# Patient Record
Sex: Male | Born: 1965 | Race: White | Hispanic: No | Marital: Married | Smoking: Former smoker
Health system: Southern US, Community
[De-identification: ages and names within clinical notes are randomized; demographics above are authoritative.]

## PROBLEM LIST (undated history)

## (undated) DIAGNOSIS — G629 Polyneuropathy, unspecified: Secondary | ICD-10-CM

## (undated) DIAGNOSIS — M109 Gout, unspecified: Secondary | ICD-10-CM

## (undated) HISTORY — DX: Polyneuropathy, unspecified: G62.9

---

## 2000-12-21 ENCOUNTER — Emergency Department (HOSPITAL_COMMUNITY): Admission: EM | Admit: 2000-12-21 | Discharge: 2000-12-21 | Payer: Self-pay | Admitting: Emergency Medicine

## 2000-12-21 ENCOUNTER — Encounter: Payer: Self-pay | Admitting: Emergency Medicine

## 2004-04-06 ENCOUNTER — Emergency Department (HOSPITAL_COMMUNITY): Admission: EM | Admit: 2004-04-06 | Discharge: 2004-04-06 | Payer: Self-pay | Admitting: Emergency Medicine

## 2004-05-28 ENCOUNTER — Emergency Department (HOSPITAL_COMMUNITY): Admission: EM | Admit: 2004-05-28 | Discharge: 2004-05-28 | Payer: Self-pay | Admitting: Emergency Medicine

## 2004-05-29 ENCOUNTER — Emergency Department (HOSPITAL_COMMUNITY): Admission: EM | Admit: 2004-05-29 | Discharge: 2004-05-29 | Payer: Self-pay | Admitting: Emergency Medicine

## 2004-10-07 ENCOUNTER — Emergency Department (HOSPITAL_COMMUNITY): Admission: EM | Admit: 2004-10-07 | Discharge: 2004-10-07 | Payer: Self-pay | Admitting: Emergency Medicine

## 2005-02-23 ENCOUNTER — Emergency Department (HOSPITAL_COMMUNITY): Admission: EM | Admit: 2005-02-23 | Discharge: 2005-02-23 | Payer: Self-pay | Admitting: *Deleted

## 2007-09-13 ENCOUNTER — Emergency Department (HOSPITAL_COMMUNITY): Admission: EM | Admit: 2007-09-13 | Discharge: 2007-09-13 | Payer: Self-pay | Admitting: Emergency Medicine

## 2007-09-27 ENCOUNTER — Ambulatory Visit: Payer: Self-pay | Admitting: Family Medicine

## 2007-09-27 DIAGNOSIS — F17200 Nicotine dependence, unspecified, uncomplicated: Secondary | ICD-10-CM

## 2007-09-27 DIAGNOSIS — M545 Low back pain, unspecified: Secondary | ICD-10-CM | POA: Insufficient documentation

## 2007-09-27 DIAGNOSIS — K029 Dental caries, unspecified: Secondary | ICD-10-CM | POA: Insufficient documentation

## 2007-09-27 DIAGNOSIS — M25529 Pain in unspecified elbow: Secondary | ICD-10-CM | POA: Insufficient documentation

## 2007-09-27 DIAGNOSIS — M109 Gout, unspecified: Secondary | ICD-10-CM

## 2007-09-28 ENCOUNTER — Telehealth (INDEPENDENT_AMBULATORY_CARE_PROVIDER_SITE_OTHER): Payer: Self-pay | Admitting: Family Medicine

## 2007-09-28 ENCOUNTER — Ambulatory Visit: Payer: Self-pay | Admitting: Family Medicine

## 2007-09-28 LAB — CONVERTED CEMR LAB
Basophils Absolute: 0 10*3/uL (ref 0.0–0.1)
Basophils Relative: 0 % (ref 0–1)
Eosinophils Absolute: 0.1 10*3/uL (ref 0.0–0.7)
Eosinophils Relative: 1 % (ref 0–5)
HCT: 42.5 % (ref 39.0–52.0)
Hemoglobin: 14.3 g/dL (ref 13.0–17.0)
Lymphocytes Relative: 22 % (ref 12–46)
Lymphs Abs: 2.4 10*3/uL (ref 0.7–4.0)
MCHC: 33.6 g/dL (ref 30.0–36.0)
MCV: 92.2 fL (ref 78.0–100.0)
Monocytes Absolute: 0.8 10*3/uL (ref 0.1–1.0)
Monocytes Relative: 8 % (ref 3–12)
Neutro Abs: 7.7 10*3/uL (ref 1.7–7.7)
Neutrophils Relative %: 69 % (ref 43–77)
Platelets: 256 10*3/uL (ref 150–400)
RBC: 4.61 M/uL (ref 4.22–5.81)
RDW: 12.9 % (ref 11.5–15.5)
Uric Acid, Serum: 6.6 mg/dL (ref 4.0–7.8)
WBC: 11.1 10*3/uL — ABNORMAL HIGH (ref 4.0–10.5)

## 2007-10-25 ENCOUNTER — Ambulatory Visit: Payer: Self-pay | Admitting: Family Medicine

## 2007-11-19 ENCOUNTER — Ambulatory Visit: Payer: Self-pay | Admitting: Family Medicine

## 2007-11-19 DIAGNOSIS — M25569 Pain in unspecified knee: Secondary | ICD-10-CM

## 2007-11-20 ENCOUNTER — Telehealth (INDEPENDENT_AMBULATORY_CARE_PROVIDER_SITE_OTHER): Payer: Self-pay | Admitting: *Deleted

## 2007-11-20 ENCOUNTER — Ambulatory Visit (HOSPITAL_COMMUNITY): Admission: RE | Admit: 2007-11-20 | Discharge: 2007-11-20 | Payer: Self-pay | Admitting: Family Medicine

## 2007-11-20 ENCOUNTER — Encounter (INDEPENDENT_AMBULATORY_CARE_PROVIDER_SITE_OTHER): Payer: Self-pay | Admitting: Family Medicine

## 2007-11-20 LAB — CONVERTED CEMR LAB
Anti Nuclear Antibody(ANA): NEGATIVE
CRP: 1.2 mg/dL — ABNORMAL HIGH (ref <0.6)
Rheumatoid fact SerPl-aCnc: <20 intl units/mL (ref 0–20)
Sed Rate: 31 mm/hr — ABNORMAL HIGH (ref 0–16)
Uric Acid, Serum: 6.5 mg/dL (ref 4.0–7.8)

## 2007-11-21 ENCOUNTER — Ambulatory Visit: Payer: Self-pay | Admitting: Family Medicine

## 2007-11-29 ENCOUNTER — Encounter (INDEPENDENT_AMBULATORY_CARE_PROVIDER_SITE_OTHER): Payer: Self-pay | Admitting: Family Medicine

## 2007-12-10 ENCOUNTER — Encounter (INDEPENDENT_AMBULATORY_CARE_PROVIDER_SITE_OTHER): Payer: Self-pay | Admitting: Family Medicine

## 2007-12-19 ENCOUNTER — Ambulatory Visit: Payer: Self-pay | Admitting: Internal Medicine

## 2007-12-19 DIAGNOSIS — M5430 Sciatica, unspecified side: Secondary | ICD-10-CM

## 2008-01-18 ENCOUNTER — Encounter (INDEPENDENT_AMBULATORY_CARE_PROVIDER_SITE_OTHER): Payer: Self-pay | Admitting: Family Medicine

## 2009-05-19 IMAGING — CR DG ELBOW 2V*L*
2 series · 2 of 2 positions shown · non-contrast
Comparison: None

CLINICAL DATA: Left elbow pain

LEFT ELBOW - 2 VIEW

[view not recorded (1 of 2)]
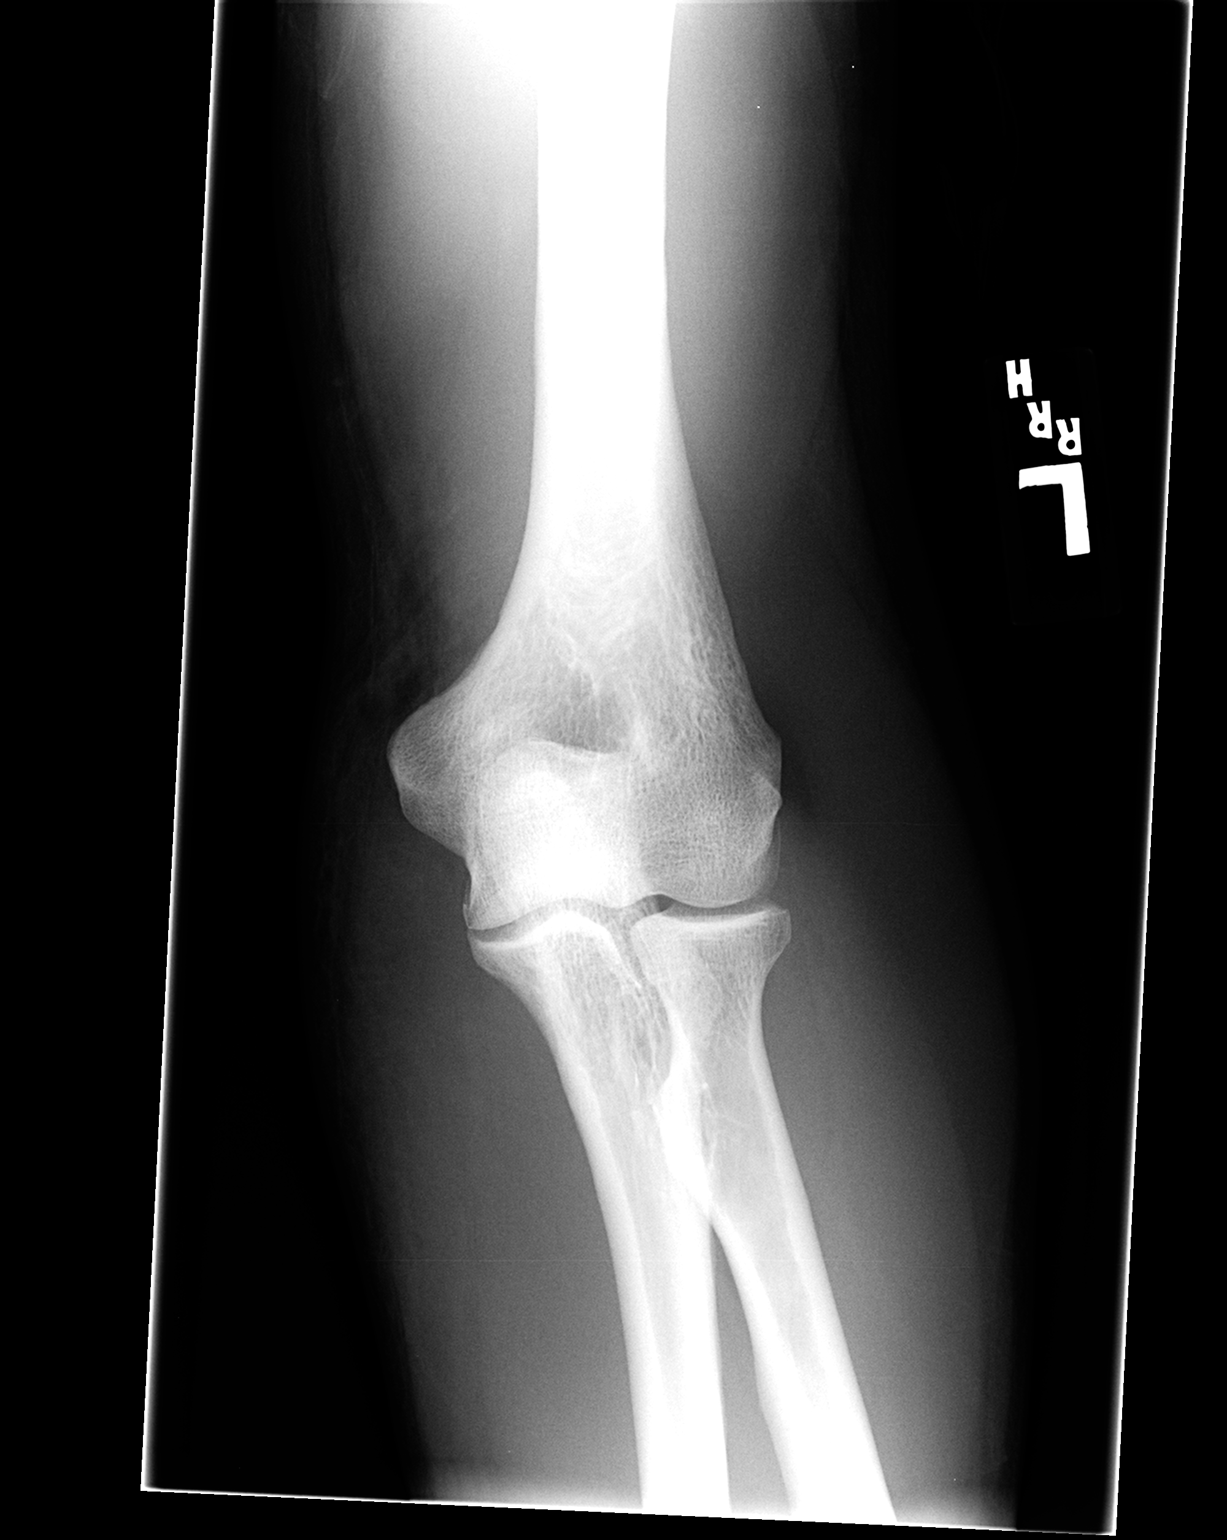

[view not recorded (2 of 2)]
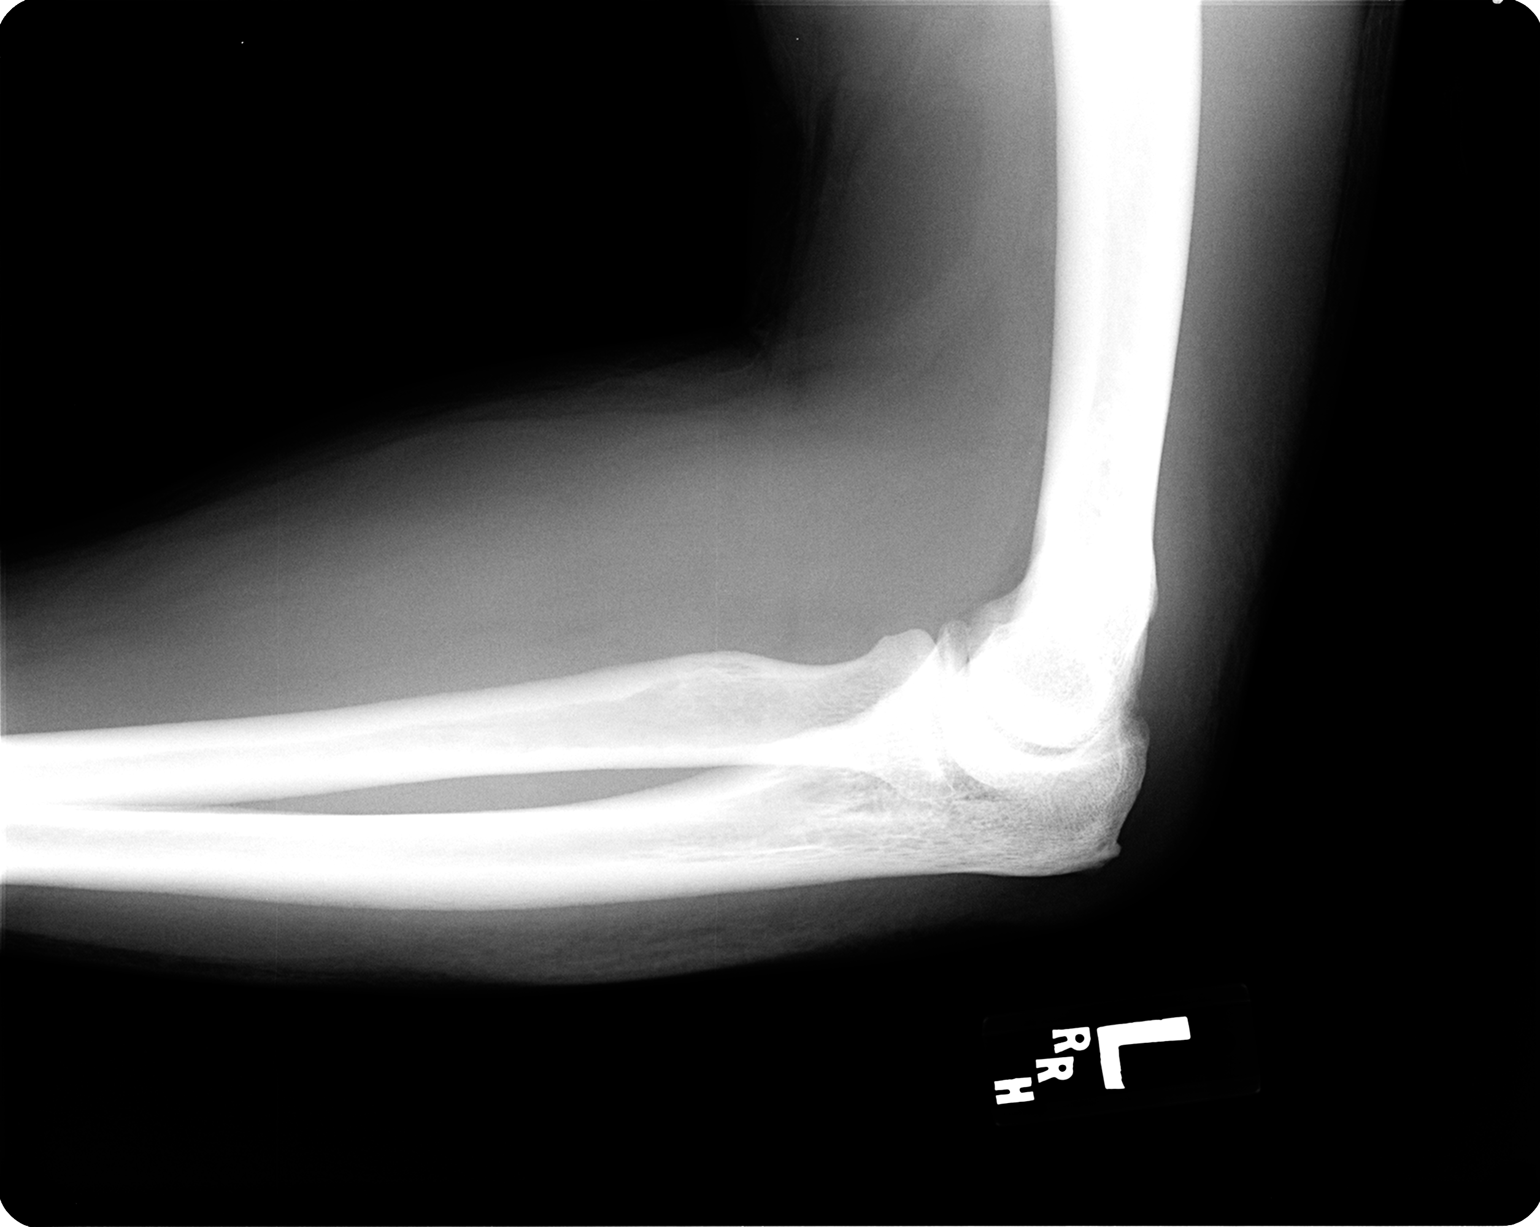

[2 of 2 positions shown; findings below may reference images not displayed]

FINDINGS: Two views of the left elbow shows no acute fracture or
subluxation.  No posterior fat pad sign.
IMPRESSION: No left elbow acute fracture or subluxation.  No posterior fat pad
sign.

## 2012-11-12 ENCOUNTER — Emergency Department (HOSPITAL_COMMUNITY): Payer: BC Managed Care – PPO

## 2012-11-12 ENCOUNTER — Encounter (HOSPITAL_COMMUNITY): Payer: Self-pay | Admitting: *Deleted

## 2012-11-12 ENCOUNTER — Emergency Department (HOSPITAL_COMMUNITY)
Admission: EM | Admit: 2012-11-12 | Discharge: 2012-11-12 | Disposition: A | Discharge status: Home / Self Care | Payer: BC Managed Care – PPO | Attending: Emergency Medicine | Admitting: Emergency Medicine

## 2012-11-12 DIAGNOSIS — M5380 Other specified dorsopathies, site unspecified: Secondary | ICD-10-CM | POA: Insufficient documentation

## 2012-11-12 DIAGNOSIS — Z8639 Personal history of other endocrine, nutritional and metabolic disease: Secondary | ICD-10-CM | POA: Insufficient documentation

## 2012-11-12 DIAGNOSIS — F17200 Nicotine dependence, unspecified, uncomplicated: Secondary | ICD-10-CM | POA: Insufficient documentation

## 2012-11-12 DIAGNOSIS — Z862 Personal history of diseases of the blood and blood-forming organs and certain disorders involving the immune mechanism: Secondary | ICD-10-CM | POA: Insufficient documentation

## 2012-11-12 DIAGNOSIS — M62830 Muscle spasm of back: Secondary | ICD-10-CM

## 2012-11-12 HISTORY — DX: Gout, unspecified: M10.9

## 2012-11-12 MED ORDER — METHOCARBAMOL 500 MG PO TABS: 1000.0000 mg | ORAL_TABLET | Freq: Four times a day (QID) | ORAL | Status: DC

## 2012-11-12 MED ORDER — DIAZEPAM 5 MG PO TABS
5.0000 mg | ORAL_TABLET | Freq: Once | ORAL | Status: AC
  Administered 2012-11-12: 5 mg via ORAL
  Filled 2012-11-12: qty 1

## 2012-11-12 MED ORDER — METHOCARBAMOL 500 MG PO TABS
1000.0000 mg | ORAL_TABLET | Freq: Once | ORAL | Status: AC
  Administered 2012-11-12: 1000 mg via ORAL
  Filled 2012-11-12: qty 2

## 2012-11-12 MED ORDER — OXYCODONE-ACETAMINOPHEN 5-325 MG PO TABS: 1.0000 | ORAL_TABLET | ORAL | Status: DC | PRN

## 2012-11-12 MED ORDER — OXYCODONE-ACETAMINOPHEN 5-325 MG PO TABS
1.0000 | ORAL_TABLET | Freq: Once | ORAL | Status: AC
  Administered 2012-11-12: 1 via ORAL
  Filled 2012-11-12: qty 1

## 2012-11-12 MED ORDER — DIAZEPAM 5 MG PO TABS: 5.0000 mg | ORAL_TABLET | Freq: Three times a day (TID) | ORAL | Status: DC | PRN

## 2012-11-12 NOTE — ED Notes (Signed)
States was working on car x 1 wk ago when may have pulled muscle then. States pain has gotten worse over past two days.  Severe pain worsening with deep breath since 0300 this morning.  Pain to right mid-lower back.

## 2012-11-16 NOTE — ED Provider Notes (Signed)
Medical screening examination/treatment/procedure(s) were performed by non-physician practitioner and as supervising physician I was immediately available for consultation/collaboration.   Zakyria Metzinger, MD 11/16/12 2259 

## 2012-11-16 NOTE — ED Provider Notes (Signed)
History     CSN: [id number]  Arrival date & time 11/12/12  0906   First MD Initiated Contact with Patient 11/12/12 [phone]      Chief Complaint  Patient presents with  . Back Pain    (Consider location/radiation/quality/duration/timing/severity/associated sxs/prior treatment) HPI Comments: Jeff Benton is a 47 y.o. Male presenting with a 1 week history of right lateral back pain which started suddenly when he reached up while working underneath a car at work.  His pain is described as sharp muscle spasm which worsens with movement,  Palpation and now with deep inspiration as it has worsened over the past 2 days.  He denies shortness of breath and chest pain, denies cough, fever and denies urinary changes including hematuria, increased frequency or dysuria.  He has tried goody powders and muscle rub without relief.  This morning before arrival he was taking a hot shower when he felt the muscle spasm worsen, therefore presenting here.    The history is provided by the patient and the spouse.    Past Medical History  Diagnosis Date  . Gout     History reviewed. No pertinent past surgical history.  No family history on file.  History  Substance Use Topics  . Smoking status: Current Every Day Smoker    Types: Cigarettes  . Smokeless tobacco: Not on file  . Alcohol Use: Yes     Comment: occasional      Review of Systems  Constitutional: Negative for fever and chills.  Respiratory: Negative for cough, chest tightness and shortness of breath.   Cardiovascular: Negative for chest pain and leg swelling.  Gastrointestinal: Negative for abdominal pain, constipation and abdominal distention.  Genitourinary: Negative for dysuria, urgency, frequency, hematuria, flank pain and difficulty urinating.  Musculoskeletal: Positive for back pain. Negative for joint swelling and gait problem.  Skin: Negative for rash.  Neurological: Negative for weakness and numbness.    Allergies   Review of patient's allergies indicates no known allergies.  Home Medications   Current Outpatient Rx  Name  Route  Sig  Dispense  Refill  . Aspirin-Acetaminophen-Caffeine (GOODYS EXTRA STRENGTH PO)   Oral   Take 1 Package by mouth every 6 (six) hours as needed (back pain).         . diazepam (VALIUM) 5 MG tablet   Oral   Take 1 tablet (5 mg total) by mouth every 8 (eight) hours as needed (muscle spasm).   15 tablet   0   . oxyCODONE-acetaminophen (PERCOCET/ROXICET) 5-325 MG per tablet   Oral   Take 1 tablet by mouth every 4 (four) hours as needed for pain.   20 tablet   0     BP 135/88  Pulse 65  Temp(Src) 98.1 F (36.7 C) (Oral)  Resp 20  Ht 5\' 10"  (1.778 m)  Wt 180 lb (81.647 kg)  BMI 25.83 kg/m2  SpO2 99%  Physical Exam  Nursing note and vitals reviewed. Constitutional: He appears well-developed and well-nourished.  HENT:  Head: Normocephalic.  Eyes: Conjunctivae are normal.  Neck: Normal range of motion. Neck supple.  Cardiovascular: Normal rate and intact distal pulses.   Pedal pulses normal.  Pulmonary/Chest: Effort normal.  Abdominal: Soft. Bowel sounds are normal. He exhibits no distension and no mass.  Musculoskeletal: Normal range of motion. He exhibits tenderness. He exhibits no edema.       Thoracic back: He exhibits tenderness and spasm. He exhibits no bony tenderness, no swelling and no edema.  Lumbar back: He exhibits tenderness. He exhibits no swelling, no edema and no spasm.  TTP right parathoracic spine,  No midline tenderness.  Muscle spasm appreciated,  No rash.  Neurological: He is alert. He has normal strength. He displays no atrophy and no tremor. No sensory deficit. Gait normal.  Equal grip strength.  Skin: Skin is warm and dry.  Psychiatric: He has a normal mood and affect.    ED Course  Procedures (including critical care time)  Labs Reviewed - No data to display No results found.   1. Muscle spasm of back    Pt was  given oxycodone and robaxin with pain mild pain improvement.  He was offered IM injections for better pain relief which he refused.  He was given another oxycodone and trial of valium for hopeful better relief of muscle spasm. Prescription for same given.   MDM  Pt with reproducible right thoracic back pain and muscle spasm.  He was prescribed oxycodone and valium.   Pt was hypertensive upon first arrival,  Near normotensive at dc.  He is perc negative, doubt PE.    Patients labs and/or radiological studies were viewed and considered during the medical decision making and disposition process.       Burgess Amor, PA-C 11/16/12 1611

## 2014-05-12 IMAGING — CR DG CHEST 2V
2 series · 2 of 2 positions shown · non-contrast
Comparison: None

CLINICAL DATA: Back pain, muscle pain since working on a car 1 week
ago, feels like a pulled muscle in right side of back

CHEST - 2 VIEW

[view not recorded (1 of 2)]
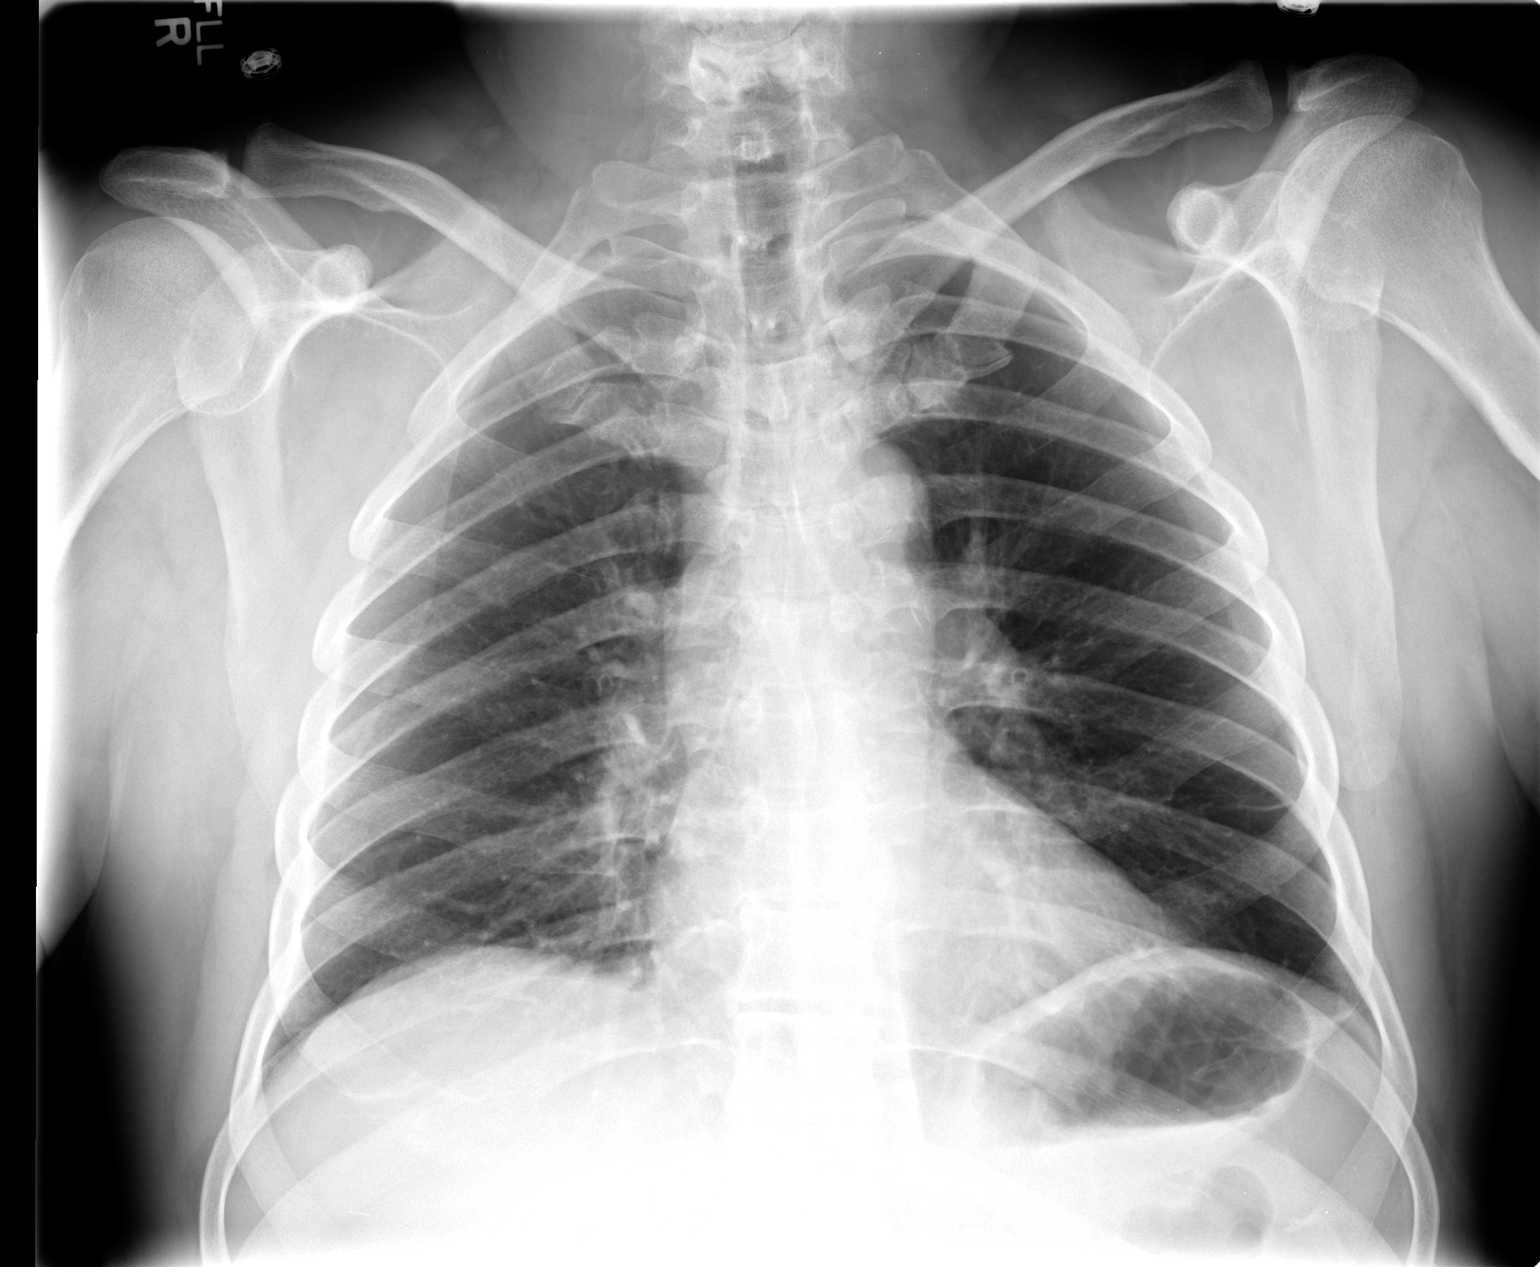

[view not recorded (2 of 2)]
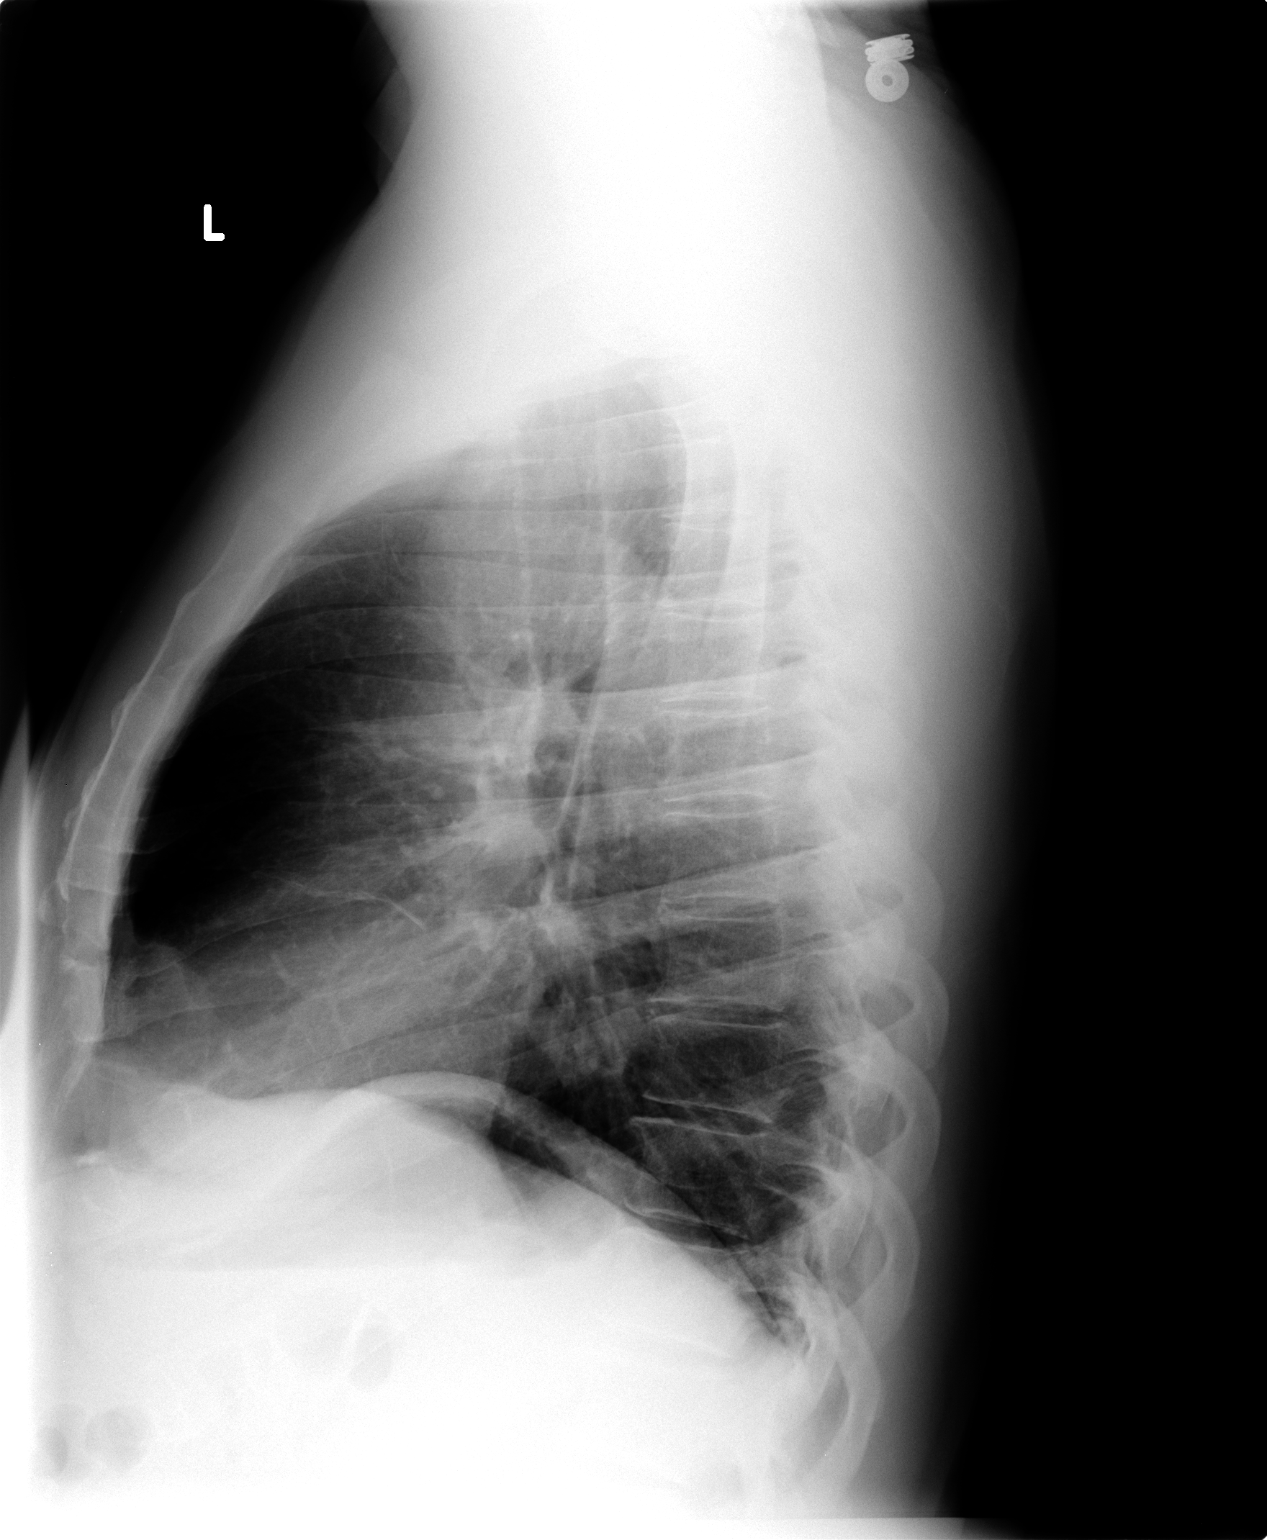

[2 of 2 positions shown; findings below may reference images not displayed]

FINDINGS: Normal heart size, mediastinal contours, and pulmonary vascularity.
Bronchitic changes without infiltrate, pleural effusion or
pneumothorax.
Minimal linear scarring versus subsegmental atelectasis at lingula.
Bones unremarkable.
IMPRESSION: Bronchitic changes with minimal scarring or subsegmental
atelectasis at lingula.

## 2015-08-27 ENCOUNTER — Telehealth: Payer: Self-pay | Admitting: Orthopaedic Surgery

## 2015-08-27 MED ORDER — HYDROCODONE-ACETAMINOPHEN 7.5-325 MG PO TABS: 1.0000 | ORAL_TABLET | ORAL | Status: DC | PRN

## 2015-08-27 NOTE — Telephone Encounter (Signed)
Rx printed

## 2015-08-27 NOTE — Telephone Encounter (Signed)
Prescription available, called patient, no answer 

## 2015-08-27 NOTE — Telephone Encounter (Signed)
Patient called to request refill of pain medication, Hydrocodone/Norco 7.5, 4 times daily, quantity 120.  His cell ph# is [phone]

## 2015-09-23 ENCOUNTER — Ambulatory Visit: Payer: Self-pay | Admitting: Orthopaedic Surgery

## 2015-10-01 ENCOUNTER — Ambulatory Visit (INDEPENDENT_AMBULATORY_CARE_PROVIDER_SITE_OTHER): Payer: Self-pay | Admitting: Orthopaedic Surgery

## 2015-10-01 ENCOUNTER — Encounter: Payer: Self-pay | Admitting: Orthopaedic Surgery

## 2015-10-01 VITALS — BP 135/89 | HR 73 | Temp 99.0°F | Resp 16 | Ht 70.0 in | Wt 197.0 lb

## 2015-10-01 DIAGNOSIS — M1A0610 Idiopathic chronic gout, right knee, without tophus (tophi): Secondary | ICD-10-CM

## 2015-10-01 MED ORDER — HYDROCODONE-ACETAMINOPHEN 7.5-325 MG PO TABS: 1.0000 | ORAL_TABLET | ORAL | Status: DC | PRN

## 2015-10-01 NOTE — Progress Notes (Signed)
Patient ZO:XWRUEAV:Jeff Benton, male DOB:1966-05-23, 50 y.o. WUJ:[id number]  Chief Complaint  Patient presents with  . Follow-up    follow up bilateral knee pain    HPI  Jeff Benton is a 50 y.o. male who has bilateral knee pain, worse on the right.  He has gout.  He takes Uloric. He has no recent attacks.  He has had giving way of the left knee once since I last saw him.  He has swelling and popping of both knees.  He has no new trauma, no redness, no locking.  HPI  Body mass index is 28.27 kg/(m^2).  Review of Systems  Constitutional:       Patient does not have Diabetes Mellitus. Patient does not have hypertension. Patient does not have COPD or shortness of breath. Patient does not have BMI > 35. Patient does not have current smoking history.  HENT: Negative for congestion.   Respiratory: Negative for shortness of breath.   Cardiovascular: Negative for chest pain.  Endocrine: Positive for cold intolerance.  Musculoskeletal: Positive for joint swelling and gait problem.    Past Medical History  Diagnosis Date  . Gout     No past surgical history on file.  No family history on file.  Social History Social History  Substance Use Topics  . Smoking status: Former Smoker    Types: Cigarettes    Quit date: 08/03/2015  . Smokeless tobacco: None  . Alcohol Use: 0.0 oz/week    0 Standard drinks or equivalent per week     Comment: occasional    No Known Allergies  Current Outpatient Prescriptions  Medication Sig Dispense Refill  . Febuxostat (ULORIC PO) Take by mouth.    Marland Kitchen. HYDROcodone-acetaminophen (NORCO) 7.5-325 MG tablet Take 1 tablet by mouth every 4 (four) hours as needed for moderate pain (Must last 30 days.  Do not drive or operate machinery while taking this medicine.). 120 tablet 0   No current facility-administered medications for this visit.     Physical Exam  Blood pressure 135/89, pulse 73, temperature 99 F (37.2 C), resp. rate 16, height 5\' 10"   (1.778 m), weight 197 lb (89.359 kg).  Constitutional: overall normal hygiene, normal nutrition, well developed, normal grooming, normal body habitus. Assistive device:none  Musculoskeletal: gait and station Limp right, muscle tone and strength are normal, no tremors or atrophy is present.  .  Neurological: coordination overall normal.  Deep tendon reflex/nerve stretch intact.  Sensation normal.  Cranial nerves II-XII intact.   Skin:   normal overall no scars, lesions, ulcers or rashes. No psoriasis.  Psychiatric: Alert and oriented x 3.  Recent memory intact, remote memory unclear.  Normal mood and affect. Well groomed.  Good eye contact.  Cardiovascular: overall no swelling, no varicosities, no edema bilaterally, normal temperatures of the legs and arms, no clubbing, cyanosis and good capillary refill.  Lymphatic: palpation is normal.  The right lower extremity is examined:  Inspection:  Thigh:  Non-tender and no defects  Knee has swelling 2+ effusion.                        Joint tenderness is present                        Patient is tender over the medial joint line  Lower Leg:  Has normal appearance and no tenderness or defects  Ankle:  Non-tender and no defects  Foot:  Non-tender  and no defects Range of Motion:  Knee:  Range of motion is: 0-110                        Crepitus is  present  Ankle:  Range of motion is normal. Strength and Tone:  The right lower extremity has normal strength and tone. Stability:  Knee:  The knee is stable.  Ankle:  The ankle is stable.   The patient has been educated about the nature of the problem(s) and counseled on treatment options.  The patient appeared to understand what I have discussed and is in agreement with it.  Encounter Diagnosis  Name Primary?  . 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  Precautions discussed.  Continue current medications.   Return to clinic 3  months

## 2015-11-02 ENCOUNTER — Telehealth: Payer: Self-pay | Admitting: Orthopaedic Surgery

## 2015-11-02 MED ORDER — HYDROCODONE-ACETAMINOPHEN 7.5-325 MG PO TABS: 1.0000 | ORAL_TABLET | ORAL | Status: DC | PRN

## 2015-11-02 NOTE — Telephone Encounter (Signed)
Rx Done . 

## 2015-11-02 NOTE — Telephone Encounter (Signed)
Patient called for refill on medication: HYDROcodone-acetaminophen (NORCO) 7.5-325 MG tablet [[id number]][[id number]] - quantity 120. Please advise.

## 2015-11-04 ENCOUNTER — Telehealth: Payer: Self-pay | Admitting: Orthopaedic Surgery

## 2015-11-04 NOTE — Telephone Encounter (Signed)
Rx Done . 

## 2015-12-03 ENCOUNTER — Telehealth: Payer: Self-pay | Admitting: Orthopaedic Surgery

## 2015-12-03 MED ORDER — HYDROCODONE-ACETAMINOPHEN 7.5-325 MG PO TABS: 1.0000 | ORAL_TABLET | ORAL | Status: DC | PRN

## 2015-12-03 NOTE — Telephone Encounter (Signed)
Patient called and requested a refill on Hydrocodone-Acetaminophen (Norco)  7.5-325 mgs. Qty 120 Sig: Take 1 tablet by mouth every 4 (four) hours as needed for moderate pain (Must last 30 days.  Do not drive or operate machinery while taking this medicine.). °

## 2015-12-03 NOTE — Telephone Encounter (Signed)
Rx done. 

## 2015-12-31 ENCOUNTER — Ambulatory Visit (INDEPENDENT_AMBULATORY_CARE_PROVIDER_SITE_OTHER): Payer: BLUE CROSS/BLUE SHIELD | Admitting: Orthopaedic Surgery

## 2015-12-31 ENCOUNTER — Encounter: Payer: Self-pay | Admitting: Orthopaedic Surgery

## 2015-12-31 VITALS — BP 161/94 | HR 71 | Temp 98.1°F | Ht 69.0 in | Wt 194.0 lb

## 2015-12-31 DIAGNOSIS — M1A0610 Idiopathic chronic gout, right knee, without tophus (tophi): Secondary | ICD-10-CM | POA: Diagnosis not present

## 2015-12-31 MED ORDER — HYDROCODONE-ACETAMINOPHEN 7.5-325 MG PO TABS: 1.0000 | ORAL_TABLET | ORAL | Status: DC | PRN

## 2015-12-31 NOTE — Progress Notes (Signed)
Patient Jeff Benton, male DOB:30-Oct-1965, 50 y.o. JXB:[id number]  Chief Complaint  Patient presents with  . Follow-up    Bilateral knee pain    HPI  Jeff Benton is a 50 y.o. male who has gout and right knee pain.  His left knee is not hurting.  He had a gouty attack of the right knee last month but is fine today. He has no new trauma, no redness, no giving way.  He is taking his gout medicine.  HPI  Body mass index is 28.64 kg/(m^2).  ROS  Review of Systems  Constitutional:       Patient does not have Diabetes Mellitus. Patient does not have hypertension. Patient does not have COPD or shortness of breath. Patient does not have BMI > 35. Patient does not have current smoking history.  HENT: Negative for congestion.   Respiratory: Negative for shortness of breath.   Cardiovascular: Negative for chest pain.  Endocrine: Positive for cold intolerance.  Musculoskeletal: Positive for joint swelling and gait problem.    Past Medical History  Diagnosis Date  . Gout     History reviewed. No pertinent past surgical history.  History reviewed. No pertinent family history.  Social History Social History  Substance Use Topics  . Smoking status: Former Smoker    Types: Cigarettes    Quit date: 08/03/2015  . Smokeless tobacco: None  . Alcohol Use: 0.0 oz/week    0 Standard drinks or equivalent per week     Comment: occasional    No Known Allergies  Current Outpatient Prescriptions  Medication Sig Dispense Refill  . HYDROcodone-acetaminophen (NORCO) 7.5-325 MG tablet Take 1 tablet by mouth every 4 (four) hours as needed for moderate pain (Must last 30 days.  Do not drive or operate machinery while taking this medicine.). 120 tablet 0  . ULORIC 80 MG TABS TAKE ONE TABLET BY MOUTH ONCE DAILY. 30 tablet 5   No current facility-administered medications for this visit.     Physical Exam  Blood pressure 161/94, pulse 71, temperature 98.1 F (36.7 C), height 5\' 9"   (1.753 m), weight 194 lb (87.998 kg).  Constitutional: overall normal hygiene, normal nutrition, well developed, normal grooming, normal body habitus. Assistive device:none  Musculoskeletal: gait and station Limp right, muscle tone and strength are normal, no tremors or atrophy is present.  .  Neurological: coordination overall normal.  Deep tendon reflex/nerve stretch intact.  Sensation normal.  Cranial nerves II-XII intact.   Skin:   normal overall no scars, lesions, ulcers or rashes. No psoriasis.  Psychiatric: Alert and oriented x 3.  Recent memory intact, remote memory unclear.  Normal mood and affect. Well groomed.  Good eye contact.  Cardiovascular: overall no swelling, no varicosities, no edema bilaterally, normal temperatures of the legs and arms, no clubbing, cyanosis and good capillary refill.  Lymphatic: palpation is normal.  The right lower extremity is examined:  Inspection:  Thigh:  Non-tender and no defects  Knee does not have swelling 0 effusion.                        Joint tenderness is present                        Patient is not tender over the medial joint line  Lower Leg:  Has normal appearance and no tenderness or defects  Ankle:  Non-tender and no defects  Foot:  Non-tender and no  defects Range of Motion:  Knee:  Range of motion is: 0-115                        Crepitus is  present  Ankle:  Range of motion is normal. Strength and Tone:  The right lower extremity has normal strength and tone. Stability:  Knee:  The knee is stable.  Ankle:  The ankle is stable.    The patient has been educated about the nature of the problem(s) and counseled on treatment options.  The patient appeared to understand what I have discussed and is in agreement with it.  Encounter Diagnosis  Name Primary?  . 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  Precautions discussed.  Continue current medications.   Return to clinic  3 months   Electronically Signed Darreld McleanWayne Daneka Lantigua, MD 6/22/20172:14 PM

## 2016-02-02 ENCOUNTER — Telehealth: Payer: Self-pay | Admitting: Orthopaedic Surgery

## 2016-02-02 NOTE — Telephone Encounter (Signed)
Hydrocodone-Acetaminophen 7.5/325mg Qty 120 Tablets °

## 2016-02-03 MED ORDER — HYDROCODONE-ACETAMINOPHEN 7.5-325 MG PO TABS: 1.0000 | ORAL_TABLET | ORAL | 0 refills | Status: DC | PRN

## 2016-03-02 ENCOUNTER — Telehealth: Payer: Self-pay | Admitting: Orthopaedic Surgery

## 2016-03-02 MED ORDER — HYDROCODONE-ACETAMINOPHEN 7.5-325 MG PO TABS: 1.0000 | ORAL_TABLET | ORAL | 0 refills | Status: DC | PRN

## 2016-03-02 NOTE — Telephone Encounter (Signed)
Patient called and requested a refill on Hydrocodone-Acetaminophen 7.5-325 mgs.  Qty 120     °       °Sig: Take 1 tablet by mouth every 4 (four) hours as needed for moderate pain (Must last 30 days.  Do not drive or operate machinery while taking this medicine.).   °  °  ° ° °

## 2016-03-31 ENCOUNTER — Encounter: Payer: Self-pay | Admitting: Orthopaedic Surgery

## 2016-03-31 ENCOUNTER — Ambulatory Visit (INDEPENDENT_AMBULATORY_CARE_PROVIDER_SITE_OTHER): Payer: BLUE CROSS/BLUE SHIELD | Admitting: Orthopaedic Surgery

## 2016-03-31 VITALS — BP 175/107 | HR 75 | Temp 98.1°F | Ht 70.0 in | Wt 196.0 lb

## 2016-03-31 DIAGNOSIS — M1A0610 Idiopathic chronic gout, right knee, without tophus (tophi): Secondary | ICD-10-CM

## 2016-03-31 NOTE — Progress Notes (Signed)
Patient ZO:XWRUEAV:Jeff Benton, male DOB:[birth year], 50 y.o. WUJ:[id number]  Chief Complaint  Patient presents with  . Follow-up    GOUT, RIGHT KNEE PAIN    HPI  Jeff Benton is a 50 y.o. male who has chronic gout, more in the left knee.  He has not had a recent attack.  He is taking his Uloric 80.  The Uloric 40 did not provide enough lowering of his uric acid and he is doing well on Uloric 80.  He has no giving way of the knees. HPI  Body mass index is 28.12 kg/m.  ROS  Review of Systems  Constitutional:       Patient does not have Diabetes Mellitus. Patient does not have hypertension. Patient does not have COPD or shortness of breath. Patient does not have BMI > 35. Patient does not have current smoking history.  HENT: Negative for congestion.   Respiratory: Negative for shortness of breath.   Cardiovascular: Negative for chest pain.  Endocrine: Positive for cold intolerance.  Musculoskeletal: Positive for gait problem and joint swelling.    Past Medical History:  Diagnosis Date  . Gout     No past surgical history on file.  No family history on file.  Social History Social History  Substance Use Topics  . Smoking status: Former Smoker    Types: Cigarettes    Quit date: 08/03/2015  . Smokeless tobacco: Not on file  . Alcohol use 0.0 oz/week     Comment: occasional    No Known Allergies  Current Outpatient Prescriptions  Medication Sig Dispense Refill  . HYDROcodone-acetaminophen (NORCO) 7.5-325 MG tablet Take 1 tablet by mouth every 4 (four) hours as needed for moderate pain (Must last 30 days.  Do not drive or operate machinery while taking this medicine.). 120 tablet 0  . ULORIC 80 MG TABS TAKE ONE TABLET BY MOUTH ONCE DAILY. 30 tablet 5   No current facility-administered medications for this visit.      Physical Exam  Blood pressure (!) 175/107, pulse 75, temperature 98.1 F (36.7 C), height 5\' 10"  (1.778 m), weight 196 lb (88.9  kg).  Constitutional: overall normal hygiene, normal nutrition, well developed, normal grooming, normal body habitus. Assistive device:none  Musculoskeletal: gait and station Limp none, muscle tone and strength are normal, no tremors or atrophy is present.  .  Neurological: coordination overall normal.  Deep tendon reflex/nerve stretch intact.  Sensation normal.  Cranial nerves II-XII intact.   Skin:   Normal overall no scars, lesions, ulcers or rashes. No psoriasis.  Psychiatric: Alert and oriented x 3.  Recent memory intact, remote memory unclear.  Normal mood and affect. Well groomed.  Good eye contact.  Cardiovascular: overall no swelling, no varicosities, no edema bilaterally, normal temperatures of the legs and arms, no clubbing, cyanosis and good capillary refill.  Lymphatic: palpation is normal.  The bilateral lower extremity is examined:  Inspection:  Thigh:  Non-tender and no defects  Knee does not have swelling 0 effusion.                        Joint tenderness is not present                        Patient is not tender over the medial joint line  Lower Leg:  Has normal appearance and no tenderness or defects  Ankle:  Non-tender and no defects  Foot:  Non-tender and no  defects Range of Motion:  Knee:  Range of motion is: full both knees                        Crepitus is  present  Ankle:  Range of motion is normal. Strength and Tone:  The bilateral lower extremity has normal strength and tone. Stability:  Knee:  The knee is stable.  Ankle:  The ankle is stable.    The patient has been educated about the nature of the problem(s) and counseled on treatment options.  The patient appeared to understand what I have discussed and is in agreement with it.  Encounter Diagnosis  Name Primary?  . 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  Precautions discussed.  Continue current medications.   Return to clinic 3  months   Electronically Signed Darreld Mclean, MD 9/21/20171:48 PM

## 2016-04-05 ENCOUNTER — Telehealth: Payer: Self-pay | Admitting: Orthopaedic Surgery

## 2016-04-05 MED ORDER — HYDROCODONE-ACETAMINOPHEN 7.5-325 MG PO TABS: 1.0000 | ORAL_TABLET | Freq: Four times a day (QID) | ORAL | 0 refills | Status: DC | PRN

## 2016-04-05 NOTE — Telephone Encounter (Signed)
Hydrocodone-Acetaminophen 7.5/325mg Qty 120 Tablets °

## 2016-04-29 ENCOUNTER — Telehealth: Payer: Self-pay | Admitting: Orthopaedic Surgery

## 2016-05-05 ENCOUNTER — Telehealth: Payer: Self-pay | Admitting: Orthopaedic Surgery

## 2016-05-05 MED ORDER — HYDROCODONE-ACETAMINOPHEN 7.5-325 MG PO TABS: 1.0000 | ORAL_TABLET | Freq: Four times a day (QID) | ORAL | 0 refills | Status: DC | PRN

## 2016-05-05 NOTE — Telephone Encounter (Signed)
Patient requests refill on Hydrocodone/Acetaminophen 7.5-325   Mgs.  Qty  110 ° °Sig: Take 1 tablet by mouth every 6 (six) hours as needed for moderate pain (Must last 30 days.  Do not drive or operate machinery while taking this medicine.). °

## 2016-06-06 ENCOUNTER — Telehealth: Payer: Self-pay | Admitting: Orthopaedic Surgery

## 2016-06-06 NOTE — Telephone Encounter (Signed)
Patient requests a refill on Hydrocodone/Acetaminophen  7.5-325  Mgs.  Qty  100   Sig: Take 1 tablet by mouth every 6 (six) hours as needed for moderate pain (Must last 30 days.Do not drive or operate machinery while taking this medicine.).

## 2016-06-07 MED ORDER — HYDROCODONE-ACETAMINOPHEN 7.5-325 MG PO TABS: 1.0000 | ORAL_TABLET | Freq: Four times a day (QID) | ORAL | 0 refills | Status: DC | PRN

## 2016-06-30 ENCOUNTER — Ambulatory Visit (INDEPENDENT_AMBULATORY_CARE_PROVIDER_SITE_OTHER): Payer: BLUE CROSS/BLUE SHIELD | Admitting: Orthopaedic Surgery

## 2016-06-30 ENCOUNTER — Encounter: Payer: Self-pay | Admitting: Orthopaedic Surgery

## 2016-06-30 VITALS — BP 121/99 | HR 71 | Temp 97.7°F | Ht 70.0 in | Wt 198.0 lb

## 2016-06-30 DIAGNOSIS — M1A0610 Idiopathic chronic gout, right knee, without tophus (tophi): Secondary | ICD-10-CM

## 2016-06-30 MED ORDER — HYDROCODONE-ACETAMINOPHEN 7.5-325 MG PO TABS: 1.0000 | ORAL_TABLET | Freq: Four times a day (QID) | ORAL | 0 refills | Status: DC | PRN

## 2016-06-30 NOTE — Progress Notes (Signed)
Patient ZO:XWRUEAV:Jeff Benton, male DOB:1966/04/03, 50 y.o. WUJ:[id number]  Chief Complaint  Patient presents with  . Follow-up    gout    HPI  Jeff Benton is a 50 y.o. male who has chronic gout.  He has not had an attack recently.  He is taking Uloric 80 daily.  He has some slight tenderness of the right knee at times. He has no giving way.  He has no trauma.  He has no redness. HPI  Body mass index is 28.41 kg/m.  ROS  Review of Systems  Constitutional:       Patient does not have Diabetes Mellitus. Patient does not have hypertension. Patient does not have COPD or shortness of breath. Patient does not have BMI > 35. Patient does not have current smoking history.  HENT: Negative for congestion.   Respiratory: Negative for shortness of breath.   Cardiovascular: Negative for chest pain.  Endocrine: Positive for cold intolerance.  Musculoskeletal: Positive for gait problem and joint swelling.    Past Medical History:  Diagnosis Date  . Gout     No past surgical history on file.  No family history on file.  Social History Social History  Substance Use Topics  . Smoking status: Former Smoker    Types: Cigarettes    Quit date: 08/03/2015  . Smokeless tobacco: Not on file  . Alcohol use 0.0 oz/week     Comment: occasional    No Known Allergies  Current Outpatient Prescriptions  Medication Sig Dispense Refill  . HYDROcodone-acetaminophen (NORCO) 7.5-325 MG tablet Take 1 tablet by mouth every 6 (six) hours as needed for moderate pain (Must last 30 days.Do not drive or operate machinery while taking this medicine.). 100 tablet 0  . ULORIC 80 MG TABS TAKE ONE TABLET BY MOUTH ONCE DAILY. 30 tablet 5   No current facility-administered medications for this visit.      Physical Exam  Blood pressure (!) 121/99, pulse 71, temperature 97.7 F (36.5 C), height 5\' 10"  (1.778 m), weight 198 lb (89.8 kg).  Constitutional: overall normal hygiene, normal nutrition, well  developed, normal grooming, normal body habitus. Assistive device:none  Musculoskeletal: gait and station Limp none, muscle tone and strength are normal, no tremors or atrophy is present.  .  Neurological: coordination overall normal.  Deep tendon reflex/nerve stretch intact.  Sensation normal.  Cranial nerves II-XII intact.   Skin:   Normal overall no scars, lesions, ulcers or rashes. No psoriasis.  Psychiatric: Alert and oriented x 3.  Recent memory intact, remote memory unclear.  Normal mood and affect. Well groomed.  Good eye contact.  Cardiovascular: overall no swelling, no varicosities, no edema bilaterally, normal temperatures of the legs and arms, no clubbing, cyanosis and good capillary refill.  Lymphatic: palpation is normal.  He has full ROM of both knees and normal gait.  The patient has been educated about the nature of the problem(s) and counseled on treatment options.  The patient appeared to understand what I have discussed and is in agreement with it.  Encounter Diagnosis  Name Primary?  . 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  Precautions discussed.  Continue current medications.   Return to clinic 3 months   Electronically Signed Darreld McleanWayne Likisha Alles, MD 12/21/201711:01 AM

## 2016-08-09 ENCOUNTER — Telehealth: Payer: Self-pay | Admitting: Orthopaedic Surgery

## 2016-08-09 MED ORDER — HYDROCODONE-ACETAMINOPHEN 7.5-325 MG PO TABS: 1.0000 | ORAL_TABLET | Freq: Four times a day (QID) | ORAL | 0 refills | Status: DC | PRN

## 2016-08-09 NOTE — Telephone Encounter (Signed)
Hydrocodone-Acetaminophen  7.5/325mg  Qty 100 Tablets °

## 2016-08-31 ENCOUNTER — Telehealth: Payer: Self-pay | Admitting: Radiology

## 2016-08-31 ENCOUNTER — Encounter: Payer: Self-pay | Admitting: Orthopaedic Surgery

## 2016-08-31 ENCOUNTER — Ambulatory Visit (INDEPENDENT_AMBULATORY_CARE_PROVIDER_SITE_OTHER): Payer: BLUE CROSS/BLUE SHIELD | Admitting: Orthopaedic Surgery

## 2016-08-31 VITALS — BP 145/88 | HR 62 | Temp 97.7°F | Ht 70.0 in | Wt 196.0 lb

## 2016-08-31 DIAGNOSIS — M1A0610 Idiopathic chronic gout, right knee, without tophus (tophi): Secondary | ICD-10-CM | POA: Diagnosis not present

## 2016-08-31 DIAGNOSIS — M79672 Pain in left foot: Secondary | ICD-10-CM | POA: Diagnosis not present

## 2016-08-31 DIAGNOSIS — M79671 Pain in right foot: Secondary | ICD-10-CM

## 2016-08-31 MED ORDER — PREDNISONE 5 MG (21) PO TBPK: ORAL_TABLET | ORAL | 0 refills | Status: DC

## 2016-08-31 NOTE — Telephone Encounter (Signed)
I spoke with the patient and gave him the apt information [place] [facility] [place].  His apt is 09/01/16 @ 8:00am.

## 2016-08-31 NOTE — Progress Notes (Signed)
Patient VQ:QVZDGLO Jeff Benton, male DOB:May 14, 1966, 51 y.o. VFI:[id number]  Chief Complaint  Patient presents with  . Follow-up    GOUT    HPI  Jeff Benton is a 51 y.o. male who has chronic gout.  He has had new attack recently.  He is on Uloric 80 daily.  I will get new serum uric acid level.  I will give week dose of medrol dose pack.  I will have him return in one week.  He has been having burning sensation in the feet and toes unrelated to gout.  I am concerned about peripheral neuropathy.  I will get EMG's. HPI  Body mass index is 28.12 kg/m.  ROS  Review of Systems  Constitutional:       Patient does not have Diabetes Mellitus. Patient does not have hypertension. Patient does not have COPD or shortness of breath. Patient does not have BMI > 35. Patient does not have current smoking history.  HENT: Negative for congestion.   Respiratory: Negative for shortness of breath.   Cardiovascular: Negative for chest pain.  Endocrine: Positive for cold intolerance.  Musculoskeletal: Positive for gait problem and joint swelling.    Past Medical History:  Diagnosis Date  . Gout     No past surgical history on file.  No family history on file.  Social History Social History  Substance Use Topics  . Smoking status: Former Smoker    Types: Cigarettes    Quit date: 08/03/2015  . Smokeless tobacco: Never Used  . Alcohol use 0.0 oz/week     Comment: occasional    No Known Allergies  Current Outpatient Prescriptions  Medication Sig Dispense Refill  . HYDROcodone-acetaminophen (NORCO) 7.5-325 MG tablet Take 1 tablet by mouth every 6 (six) hours as needed for moderate pain (Must last 30 days.Do not drive or operate machinery while taking this medicine.). 100 tablet 0  . predniSONE (STERAPRED UNI-PAK 21 TAB) 5 MG (21) TBPK tablet Take 6 pills first day; 5 pills second day; 4 pills third day; 3 pills fourth day; 2 pills next day and 1 pill last day. 21 tablet 0  . ULORIC  80 MG TABS TAKE ONE TABLET BY MOUTH ONCE DAILY. 30 tablet 5   No current facility-administered medications for this visit.      Physical Exam  Blood pressure (!) 145/88, pulse 62, temperature 97.7 F (36.5 C), height 5\' 10"  (1.778 m), weight 196 lb (88.9 kg).  Constitutional: overall normal hygiene, normal nutrition, well developed, normal grooming, normal body habitus. Assistive device:none  Musculoskeletal: gait and station Limp none, muscle tone and strength are normal, no tremors or atrophy is present.  .  Neurological: coordination overall normal.  Deep tendon reflex/nerve stretch intact.  Sensation normal.  Cranial nerves II-XII intact.   Skin:   Normal overall no scars, lesions, ulcers or rashes. No psoriasis.  Psychiatric: Alert and oriented x 3.  Recent memory intact, remote memory unclear.  Normal mood and affect. Well groomed.  Good eye contact.  Cardiovascular: overall no swelling, no varicosities, no edema bilaterally, normal temperatures of the legs and arms, no clubbing, cyanosis and good capillary refill.  Lymphatic: palpation is normal.  Both knees are tender.  Both great toes are tender.  Sensation is normal to the feet.    The patient has been educated about the nature of the problem(s) and counseled on treatment options.  The patient appeared to understand what I have discussed and is in agreement with it.  Encounter  Diagnoses  Name Primary?  . Idiopathic chronic gout of right knee without tophus Yes  . Pain in both feet     PLAN Call if any problems.  Precautions discussed.  Continue current medications.   Get EMG to determine if he has peripheral neuropathy of the feet.  Return to clinic 1 week   Get serum uric levels.  Electronically Signed Darreld McleanWayne Kadiatou Oplinger, MD 2/21/201810:47 AM

## 2016-09-07 ENCOUNTER — Ambulatory Visit (INDEPENDENT_AMBULATORY_CARE_PROVIDER_SITE_OTHER): Payer: BLUE CROSS/BLUE SHIELD | Admitting: Orthopaedic Surgery

## 2016-09-07 ENCOUNTER — Encounter: Payer: Self-pay | Admitting: Orthopaedic Surgery

## 2016-09-07 VITALS — BP 171/92 | HR 84 | Temp 98.2°F | Ht 70.0 in | Wt 195.0 lb

## 2016-09-07 DIAGNOSIS — G609 Hereditary and idiopathic neuropathy, unspecified: Secondary | ICD-10-CM

## 2016-09-07 DIAGNOSIS — M79671 Pain in right foot: Secondary | ICD-10-CM

## 2016-09-07 DIAGNOSIS — M79672 Pain in left foot: Secondary | ICD-10-CM | POA: Diagnosis not present

## 2016-09-07 DIAGNOSIS — M1A0610 Idiopathic chronic gout, right knee, without tophus (tophi): Secondary | ICD-10-CM | POA: Diagnosis not present

## 2016-09-07 NOTE — Progress Notes (Signed)
Patient Jeff Benton:GMWNUUV:Kharon Annett GulaD [place], male DOB:1966/05/20, 51 y.o. OZD:[id number]  Chief Complaint  Patient presents with  . Follow-up    Gout and EMG/NCV studies lower extreemities    HPI  Jeff Benton is a 51 y.o. male who has gout and pain in the feet.  His latest uric acid was 7.8, normal to 8.6.  His EMGs of the lower extremities shows a polyneuropathy.  I have asked him to talk to his family doctor and to get an appointment to see a neurologist.  He will do so.  I have explained polyneuropathy to him. HPI  Body mass index is 27.98 kg/m.  ROS  Review of Systems  Constitutional:       Patient does not have Diabetes Mellitus. Patient does not have hypertension. Patient does not have COPD or shortness of breath. Patient does not have BMI > 35. Patient does not have current smoking history.  HENT: Negative for congestion.   Respiratory: Negative for shortness of breath.   Cardiovascular: Negative for chest pain.  Endocrine: Positive for cold intolerance.  Musculoskeletal: Positive for gait problem and joint swelling.    Past Medical History:  Diagnosis Date  . Gout     No past surgical history on file.  Family history of gout, diabetes and hypertension.  Social History Social History  Substance Use Topics  . Smoking status: Former Smoker    Types: Cigarettes    Quit date: 08/03/2015  . Smokeless tobacco: Never Used  . Alcohol use 0.0 oz/week     Comment: occasional    No Known Allergies  Current Outpatient Prescriptions  Medication Sig Dispense Refill  . HYDROcodone-acetaminophen (NORCO) 7.5-325 MG tablet Take 1 tablet by mouth every 6 (six) hours as needed for moderate pain (Must last 30 days.Do not drive or operate machinery while taking this medicine.). 100 tablet 0  . predniSONE (STERAPRED UNI-PAK 21 TAB) 5 MG (21) TBPK tablet Take 6 pills first day; 5 pills second day; 4 pills third day; 3 pills fourth day; 2 pills next day and 1 pill last day. 21 tablet  0  . ULORIC 80 MG TABS TAKE ONE TABLET BY MOUTH ONCE DAILY. 30 tablet 5   No current facility-administered medications for this visit.      Physical Exam  Blood pressure (!) 171/92, pulse 84, temperature 98.2 F (36.8 C), height 5\' 10"  (1.778 m), weight 195 lb (88.5 kg).  Constitutional: overall normal hygiene, normal nutrition, well developed, normal grooming, normal body habitus. Assistive device:none  Musculoskeletal: gait and station Limp none, muscle tone and strength are normal, no tremors or atrophy is present.  .  Neurological: coordination overall normal.  Deep tendon reflex/nerve stretch intact.  Sensation normal.  Cranial nerves II-XII intact.   Skin:   Normal overall no scars, lesions, ulcers or rashes. No psoriasis.  Psychiatric: Alert and oriented x 3.  Recent memory intact, remote memory unclear.  Normal mood and affect. Well groomed.  Good eye contact.  Cardiovascular: overall no swelling, no varicosities, no edema bilaterally, normal temperatures of the legs and arms, no clubbing, cyanosis and good capillary refill.  Lymphatic: palpation is normal.  He has no swelling of the knees or the feet.  He has sensation of the feet.  He has no ulcers or skin changes.  The patient has been educated about the nature of the problem(s) and counseled on treatment options.  The patient appeared to understand what I have discussed and is in agreement with it.  Encounter  Diagnoses  Name Primary?  . Hereditary and idiopathic peripheral neuropathy Yes  . Idiopathic chronic gout of right knee without tophus   . Pain in both feet     PLAN Call if any problems.  Precautions discussed.  Continue current medications.   Return to clinic 1 month   Electronically Signed Darreld Mclean, MD 2/28/201811:09 AM

## 2016-09-08 ENCOUNTER — Telehealth: Payer: Self-pay | Admitting: Orthopaedic Surgery

## 2016-09-08 ENCOUNTER — Encounter: Payer: Self-pay | Admitting: Orthopedic Surgery

## 2016-09-08 MED ORDER — HYDROCODONE-ACETAMINOPHEN 7.5-325 MG PO TABS: 1.0000 | ORAL_TABLET | Freq: Four times a day (QID) | ORAL | 0 refills | Status: DC | PRN

## 2016-09-08 NOTE — Telephone Encounter (Signed)
Patient called for  HYDROcodone-acetaminophen (NORCO) 7.5-325 MG tablet 100 tablet   as discussed at office visit.

## 2016-09-15 ENCOUNTER — Ambulatory Visit: Payer: BLUE CROSS/BLUE SHIELD | Admitting: Neurology

## 2016-09-28 ENCOUNTER — Ambulatory Visit: Payer: BLUE CROSS/BLUE SHIELD | Admitting: Orthopaedic Surgery

## 2016-10-03 ENCOUNTER — Ambulatory Visit (INDEPENDENT_AMBULATORY_CARE_PROVIDER_SITE_OTHER): Payer: BLUE CROSS/BLUE SHIELD | Admitting: Neurology

## 2016-10-03 ENCOUNTER — Encounter: Payer: Self-pay | Admitting: Neurology

## 2016-10-03 VITALS — BP 193/106 | HR 70 | Ht 70.0 in | Wt 198.0 lb

## 2016-10-03 DIAGNOSIS — R202 Paresthesia of skin: Secondary | ICD-10-CM | POA: Diagnosis not present

## 2016-10-03 DIAGNOSIS — R269 Unspecified abnormalities of gait and mobility: Secondary | ICD-10-CM

## 2016-10-03 DIAGNOSIS — M542 Cervicalgia: Secondary | ICD-10-CM | POA: Diagnosis not present

## 2016-10-03 MED ORDER — GABAPENTIN 300 MG PO CAPS: 300.0000 mg | ORAL_CAPSULE | Freq: Three times a day (TID) | ORAL | 11 refills | Status: AC

## 2016-10-03 NOTE — Progress Notes (Signed)
PATIENT: Jeff Benton DOB: [birth year]  Chief Complaint  Patient presents with  . Peripheral Neuropathy    Reports pain,numbness and tingling in his bilateral feet/legs for the last two years.  He started having the same symptoms in his hands about seven months ago.  Prednisone was temporarily helpful for his pain. He has had recent NCV/EMG.  . PCP    [facility]  . Orthopedic Surgery    Darreld McleanWayne Keeling, MD - referring MD     HISTORICAL  Jeff Benton is a 51 years old right-handed male, seen in refer by orthopedic surgeon Dr. Darreld McleanWayne Keeling for evaluation of peripheral neuropathy. Initial evaluation was on October 03 2016.  I reviewed and summarized referring note, he had a history of chronic gout, allergic to allopurinol, with severe bilateral lower extremity swelling, he is now taking uloric 80 mg every day, his gout is overall under good control, his gout usually is involving bilateral feet, or knee joints. He still has chronic pain, taking Narco 7.5/325 mg 3 tablets a day since 2016. He works as a Education officer, museumself employed 8 mechanics, heavy equipment, drink alcohol mild to moderately only on the weekend. Quit smoking since January 2017  Since 2016, he began to notice different pain, starting at his toes and the bottom of his feet, burning needle pricking sensation, difficulty walking, shooting eyes pricking pain forces toes, gradually getting worse, since September of 2017, he also noticed fingertips paresthesia, radiating pain, now he can barely bear weight.  He had a history of neck pain, left shoulder pain, gait abnormality attributed to bilateral feet pain, he denies bowel and bladder incontinence.  I was able to review EM[facility], there was absent right peroneal sensory response, decreased snap amplitude on left peroneal sensory response, reported normal bilateral sural sensory responses,  bilateral peroneal to EDB and tibial motor responses. Normal tibial H reflexes. EMG of lower extremity muscles was within normal limits.   REVIEW OF SYSTEMS: Full 14 system review of systems performed and notable only for joint pain, cramps, aching muscles, fatigue, headache, numbness, weakness, sleepiness, restless leg, not enough sleep, decreased energy  ALLERGIES: Allergies  Allergen Reactions  . Allopurinol Swelling    HOME MEDICATIONS: Current Outpatient Prescriptions  Medication Sig Dispense Refill  . HYDROcodone-acetaminophen (NORCO) 7.5-325 MG tablet Take 1 tablet by mouth every 6 (six) hours as needed for moderate pain (Must last 30 days.Do not drive or operate machinery while taking this medicine.). 100 tablet 0  . ULORIC 80 MG TABS TAKE ONE TABLET BY MOUTH ONCE DAILY. 30 tablet 5   No current facility-administered medications for this visit.     PAST MEDICAL HISTORY: Past Medical History:  Diagnosis Date  . Gout   . Peripheral neuropathy (HCC)     PAST SURGICAL HISTORY: History reviewed. No pertinent surgical history.  FAMILY HISTORY: Family History  Problem Relation Age of Onset  . Hypotension Mother   . Emphysema Father     SOCIAL HISTORY:  Social History   Social History  . Marital status: Married    Spouse name: N/A  . Number of children: 0  . Years of education: HS   Occupational History  . Mechanic    Social History Main Topics  . Smoking status: Former Smoker    Types: Cigarettes    Quit date: 08/03/2015  . Smokeless tobacco: Never Used  . Alcohol use 0.0 oz/week  Comment: occasional  . Drug use: No  . Sexual activity: Not on file   Other Topics Concern  . Not on file   Social History Narrative   Lives at home with wife and stepson.   Right-handed.   No caffeine use.     PHYSICAL EXAM   Vitals:   10/03/16 1512  BP: (!) 193/106  Pulse: 70  Weight: 198 lb (89.8 kg)  Height: 5\' 10"  (1.778 m)    Not recorded      Body  mass index is 28.41 kg/m.  PHYSICAL EXAMNIATION:  Gen: NAD, conversant, well nourised, obese, well groomed                     Cardiovascular: Regular rate rhythm, no peripheral edema, warm, nontender. Eyes: Conjunctivae clear without exudates or hemorrhage Neck: Supple, no carotid bruits. Pulmonary: Clear to auscultation bilaterally   NEUROLOGICAL EXAM:  MENTAL STATUS: Speech:    Speech is normal; fluent and spontaneous with normal comprehension.  Cognition:     Orientation to time, place and person     Normal recent and remote memory     Normal Attention span and concentration     Normal Language, naming, repeating,spontaneous speech     Fund of knowledge   CRANIAL NERVES: CN II: Visual fields are full to confrontation. Fundoscopic exam is normal with sharp discs and no vascular changes. Pupils are round equal and briskly reactive to light. CN III, IV, VI: extraocular movement are normal. No ptosis. CN V: Facial sensation is intact to pinprick in all 3 divisions bilaterally. Corneal responses are intact.  CN VII: Face is symmetric with normal eye closure and smile. CN VIII: Hearing is normal to rubbing fingers CN IX, X: Palate elevates symmetrically. Phonation is normal. CN XI: Head turning and shoulder shrug are intact CN XII: Tongue is midline with normal movements and no atrophy.  MOTOR: There is no pronator drift of out-stretched arms. Muscle bulk and tone are normal. Muscle strength is normal.  REFLEXES: Reflexes are 3 and symmetric at the biceps, triceps, knees, and ankles. Plantar responses are extensor bilaterally.  SENSORY: Intact to light touch, pinprick, positional sensation and vibratory sensation are intact in fingers and toes.  COORDINATION: Rapid alternating movements and fine finger movements are intact. There is no dysmetria on finger-to-nose and heel-knee-shin.    GAIT/STANCE: Posture is normal. Gait is steady with normal steps, base, arm swing, and  turning. Heel and toe walking are normal. Tandem gait is normal.  Romberg is absent.   DIAGNOSTIC DATA (LABS, IMAGING, TESTING) - I reviewed patient records, labs, notes, testing and imaging myself where available.   ASSESSMENT AND PLAN  KAYHAN BOARDLEY is a 51 y.o. male   Bilateral upper and lower extremity neuropathic pain, hyperreflexia on examinations  Differentiation diagnosis including cervical myelopathy, versus peripheral neuropathy  MRI of cervical spine  Laboratory evaluations   Levert Feinstein, M.D. Ph.D.  [facility] [address], [address] [place], [place] [address] Ph: [phone] Fax: [phone]  CC: Referring Provider

## 2016-10-05 ENCOUNTER — Telehealth: Payer: Self-pay | Admitting: Neurology

## 2016-10-05 ENCOUNTER — Encounter: Payer: Self-pay | Admitting: *Deleted

## 2016-10-05 ENCOUNTER — Ambulatory Visit (INDEPENDENT_AMBULATORY_CARE_PROVIDER_SITE_OTHER): Payer: BLUE CROSS/BLUE SHIELD | Admitting: Orthopaedic Surgery

## 2016-10-05 ENCOUNTER — Encounter: Payer: Self-pay | Admitting: Orthopaedic Surgery

## 2016-10-05 VITALS — BP 169/100 | HR 71 | Temp 97.9°F | Ht 70.0 in | Wt 201.0 lb

## 2016-10-05 DIAGNOSIS — M1A0610 Idiopathic chronic gout, right knee, without tophus (tophi): Secondary | ICD-10-CM | POA: Diagnosis not present

## 2016-10-05 DIAGNOSIS — G609 Hereditary and idiopathic neuropathy, unspecified: Secondary | ICD-10-CM

## 2016-10-05 LAB — COMPREHENSIVE METABOLIC PANEL
ALBUMIN: 4.7 g/dL (ref 3.5–5.5)
ALK PHOS: 76 IU/L (ref 39–117)
ALT: 16 IU/L (ref 0–44)
AST: 15 IU/L (ref 0–40)
Albumin/Globulin Ratio: 1.7 (ref 1.2–2.2)
BILIRUBIN TOTAL: 0.4 mg/dL (ref 0.0–1.2)
BUN / CREAT RATIO: 6 — AB (ref 9–20)
BUN: 6 mg/dL (ref 6–24)
CO2: 23 mmol/L (ref 18–29)
Calcium: 9 mg/dL (ref 8.7–10.2)
Chloride: 102 mmol/L (ref 96–106)
Creatinine, Ser: 0.99 mg/dL (ref 0.76–1.27)
GFR calc non Af Amer: 88 mL/min/{1.73_m2} (ref >59)
GFR, EST AFRICAN AMERICAN: 102 mL/min/{1.73_m2} (ref >59)
Globulin, Total: 2.7 g/dL (ref 1.5–4.5)
Glucose: 84 mg/dL (ref 65–99)
Potassium: 4.2 mmol/L (ref 3.5–5.2)
SODIUM: 141 mmol/L (ref 134–144)

## 2016-10-05 LAB — CBC
HEMATOCRIT: 43 % (ref 37.5–51.0)
Hemoglobin: 14.7 g/dL (ref 13.0–17.7)
MCH: 31.3 pg (ref 26.6–33.0)
MCHC: 34.2 g/dL (ref 31.5–35.7)
MCV: 92 fL (ref 79–97)
PLATELETS: 275 10*3/uL (ref 150–379)
RBC: 4.69 x10E6/uL (ref 4.14–5.80)
RDW: 13.8 % (ref 12.3–15.4)
WBC: 9.1 10*3/uL (ref 3.4–10.8)

## 2016-10-05 LAB — THYROID PANEL WITH TSH
Free Thyroxine Index: 1.8 (ref 1.2–4.9)
T3 Uptake Ratio: 29 % (ref 24–39)
T4 TOTAL: 6.3 ug/dL (ref 4.5–12.0)
TSH: 2.23 u[IU]/mL (ref 0.450–4.500)

## 2016-10-05 LAB — C-REACTIVE PROTEIN: CRP: 2.1 mg/L (ref 0.0–4.9)

## 2016-10-05 LAB — COPPER, SERUM: Copper: 119 ug/dL (ref 72–166)

## 2016-10-05 LAB — ANA W/REFLEX IF POSITIVE: Anti Nuclear Antibody(ANA): NEGATIVE

## 2016-10-05 LAB — CK: CK TOTAL: 109 U/L (ref 24–204)

## 2016-10-05 LAB — IMMUNOFIXATION ELECTROPHORESIS
IGM (IMMUNOGLOBULIN M), SRM: 385 mg/dL — AB (ref 20–172)
IgA/Immunoglobulin A, Serum: 194 mg/dL (ref 90–386)
IgG (Immunoglobin G), Serum: 935 mg/dL (ref 700–1600)
TOTAL PROTEIN: 7.4 g/dL (ref 6.0–8.5)

## 2016-10-05 LAB — HIV ANTIBODY (ROUTINE TESTING W REFLEX): HIV SCREEN 4TH GENERATION: NONREACTIVE

## 2016-10-05 LAB — HGB A1C W/O EAG: Hgb A1c MFr Bld: 5.1 % (ref 4.8–5.6)

## 2016-10-05 LAB — VITAMIN B12: Vitamin B-12: 352 pg/mL (ref 232–1245)

## 2016-10-05 LAB — RPR: RPR Ser Ql: NONREACTIVE

## 2016-10-05 LAB — VITAMIN D 25 HYDROXY (VIT D DEFICIENCY, FRACTURES): VIT D 25 HYDROXY: 10 ng/mL — AB (ref 30.0–100.0)

## 2016-10-05 LAB — RHEUMATOID FACTOR: Rhuematoid fact SerPl-aCnc: <10 IU/mL (ref 0.0–13.9)

## 2016-10-05 LAB — SEDIMENTATION RATE: SED RATE: 8 mm/h (ref 0–30)

## 2016-10-05 MED ORDER — FEBUXOSTAT 80 MG PO TABS: 1.0000 | ORAL_TABLET | Freq: Every day | ORAL | 5 refills | Status: DC

## 2016-10-05 MED ORDER — HYDROCODONE-ACETAMINOPHEN 7.5-325 MG PO TABS: 1.0000 | ORAL_TABLET | Freq: Four times a day (QID) | ORAL | 0 refills | Status: DC | PRN

## 2016-10-05 NOTE — Progress Notes (Signed)
Patient ZO:XWRUEAV:Jeff Benton, male DOB:1966-06-20, 51 y.o. [id number]  Chief Complaint  Patient presents with  . Follow-up    gout and neuropathy    HPI  Jeff Benton is a 51 y.o. male who has chronic gout with occasional flare ups.  He has peripherial neuropathy also.  [facility] and was placed on Neurontin.  He is better. He is taking one 80 mgm Uloric daily with every other day 160 mgm.   HPI  Body mass index is 28.84 kg/m.  ROS  Review of Systems  Constitutional:       Patient does not have Diabetes Mellitus. Patient does not have hypertension. Patient does not have COPD or shortness of breath. Patient does not have BMI > 35. Patient does not have current smoking history.  HENT: Negative for congestion.   Respiratory: Negative for shortness of breath.   Cardiovascular: Negative for chest pain.  Endocrine: Positive for cold intolerance.  Musculoskeletal: Positive for gait problem and joint swelling.    Past Medical History:  Diagnosis Date  . Gout   . Peripheral neuropathy (HCC)     No past surgical history on file.  Family History  Problem Relation Age of Onset  . Hypotension Mother   . Emphysema Father     Social History Social History  Substance Use Topics  . Smoking status: Former Smoker    Types: Cigarettes    Quit date: 08/03/2015  . Smokeless tobacco: Never Used  . Alcohol use 0.0 oz/week     Comment: occasional    Allergies  Allergen Reactions  . Allopurinol Swelling    Current Outpatient Prescriptions  Medication Sig Dispense Refill  . Febuxostat (ULORIC) 80 MG TABS Take 1 tablet (80 mg total) by mouth daily. 30 tablet 5  . gabapentin (NEURONTIN) 300 MG capsule Take 1 capsule (300 mg total) by mouth 3 (three) times daily. 90 capsule 11  . HYDROcodone-acetaminophen (NORCO) 7.5-325 MG tablet Take 1 tablet by mouth every 6 (six) hours as needed for moderate pain (Must last 30 days.Do not drive or operate  machinery while taking this medicine.). 100 tablet 0   No current facility-administered medications for this visit.      Physical Exam  Blood pressure (!) 169/100, pulse 71, temperature 97.9 F (36.6 C), height 5\' 10"  (1.778 m), weight 201 lb (91.2 kg).  Constitutional: overall normal hygiene, normal nutrition, well developed, normal grooming, normal body habitus. Assistive device:none  Musculoskeletal: gait and station Limp none, muscle tone and strength are normal, no tremors or atrophy is present.  .  Neurological: coordination overall normal.  Deep tendon reflex/nerve stretch intact.  Sensation normal.  Cranial nerves II-XII intact.   Skin:   Normal overall no scars, lesions, ulcers or rashes. No psoriasis.  Psychiatric: Alert and oriented x 3.  Recent memory intact, remote memory unclear.  Normal mood and affect. Well groomed.  Good eye contact.  Cardiovascular: overall no swelling, no varicosities, no edema bilaterally, normal temperatures of the legs and arms, no clubbing, cyanosis and good capillary refill.  Lymphatic: palpation is normal.  He has no swelling of the knees or feet.  He has some tenderness of the right great toe MTP with no redness.  Gait is good.  The patient has been educated about the nature of the problem(s) and counseled on treatment options.  The patient appeared to understand what I have discussed and is in agreement with it.  Encounter Diagnoses  Name  Primary?  . Hereditary and idiopathic peripheral neuropathy Yes  . Idiopathic chronic gout of right knee without tophus     PLAN Call if any problems.  Precautions discussed.  Continue current medications.   Return to clinic 1 month   I have reviewed the [facility] Controlled Substance Reporting System web site prior to prescribing narcotic medicine for this patient.  Electronically Signed Darreld Mclean, MD 3/28/201810:25 AM

## 2016-10-05 NOTE — Telephone Encounter (Signed)
Patient aware of results and will start the recommended supplement.

## 2016-10-05 NOTE — Telephone Encounter (Signed)
Please call patient, laboratory evaluation showed significantly low vitamin D 10, he should take vitamin D3 supplement, 2000 units daily, we may consider repeat laboratory evaluation later

## 2016-10-11 ENCOUNTER — Telehealth: Payer: Self-pay | Admitting: Neurology

## 2016-10-11 NOTE — Telephone Encounter (Signed)
[employer] did not approve the MRI needing more clinical information. The phone number for the peer to peer is [phone]. The member ID is [id number] & DOB is 1965-09-20. The case does close on Thursday 10/13/16. Thank you for your help.

## 2016-10-12 NOTE — Telephone Encounter (Signed)
Noted, thank you

## 2016-10-12 NOTE — Telephone Encounter (Signed)
I did peer to peer review for MRI cervical  Valic till 11/09/2016, [phone]

## 2016-11-02 ENCOUNTER — Encounter: Payer: Self-pay | Admitting: Orthopaedic Surgery

## 2016-11-02 ENCOUNTER — Ambulatory Visit (INDEPENDENT_AMBULATORY_CARE_PROVIDER_SITE_OTHER): Payer: BLUE CROSS/BLUE SHIELD | Admitting: Orthopaedic Surgery

## 2016-11-02 VITALS — BP 166/103 | HR 64 | Temp 97.7°F | Ht 70.0 in | Wt 199.0 lb

## 2016-11-02 DIAGNOSIS — M79671 Pain in right foot: Secondary | ICD-10-CM

## 2016-11-02 DIAGNOSIS — G609 Hereditary and idiopathic neuropathy, unspecified: Secondary | ICD-10-CM

## 2016-11-02 DIAGNOSIS — M79672 Pain in left foot: Secondary | ICD-10-CM | POA: Diagnosis not present

## 2016-11-02 DIAGNOSIS — M1A0610 Idiopathic chronic gout, right knee, without tophus (tophi): Secondary | ICD-10-CM | POA: Diagnosis not present

## 2016-11-02 MED ORDER — HYDROCODONE-ACETAMINOPHEN 7.5-325 MG PO TABS: 1.0000 | ORAL_TABLET | Freq: Four times a day (QID) | ORAL | 0 refills | Status: DC | PRN

## 2016-11-02 NOTE — Progress Notes (Signed)
Patient NF:AOZHYQM Jeff Benton, male DOB:23-Jun-1966, 51 y.o. [id number]:[id number]  Chief Complaint  Patient presents with  . Follow-up    GOUT    HPI  Jeff Benton is a 51 y.o. male who has gout of multiple joints.  He is taking his Uloric and has not had a gouty attack since last seen.  He was tested for Vitamin D levels by another physician and he is low.  He is taking supplements. HPI  Body mass index is 28.55 kg/m.  ROS  Review of Systems  Constitutional:       Patient does not have Diabetes Mellitus. Patient does not have hypertension. Patient does not have COPD or shortness of breath. Patient does not have BMI > 35. Patient does not have current smoking history.  HENT: Negative for congestion.   Respiratory: Negative for shortness of breath.   Cardiovascular: Negative for chest pain.  Endocrine: Positive for cold intolerance.  Musculoskeletal: Positive for gait problem and joint swelling.    Past Medical History:  Diagnosis Date  . Gout   . Peripheral neuropathy     No past surgical history on file.  Family History  Problem Relation Age of Onset  . Hypotension Mother   . Emphysema Father     Social History Social History  Substance Use Topics  . Smoking status: Former Smoker    Types: Cigarettes    Quit date: 08/03/2015  . Smokeless tobacco: Never Used  . Alcohol use 0.0 oz/week     Comment: occasional    Allergies  Allergen Reactions  . Allopurinol Swelling    Current Outpatient Prescriptions  Medication Sig Dispense Refill  . Cholecalciferol (VITAMIN D3) 2000 units TABS Take by mouth daily.    . Febuxostat (ULORIC) 80 MG TABS Take 1 tablet (80 mg total) by mouth daily. 30 tablet 5  . gabapentin (NEURONTIN) 300 MG capsule Take 1 capsule (300 mg total) by mouth 3 (three) times daily. 90 capsule 11  . HYDROcodone-acetaminophen (NORCO) 7.5-325 MG tablet Take 1 tablet by mouth every 6 (six) hours as needed for moderate pain (Must last 30 days.Do not  drive or operate machinery while taking this medicine.). 100 tablet 0   No current facility-administered medications for this visit.      Physical Exam  Blood pressure (!) 166/103, pulse 64, temperature 97.7 F (36.5 C), height  (1.778 m), weight 199 lb (90.3 kg).  Constitutional: overall normal hygiene, normal nutrition, well developed, normal grooming, normal body habitus. Assistive device:none  Musculoskeletal: gait and station Limp none, muscle tone and strength are normal, no tremors or atrophy is present.  .  Neurological: coordination overall normal.  Deep tendon reflex/nerve stretch intact.  Sensation normal.  Cranial nerves II-XII intact.   Skin:   Normal overall no scars, lesions, ulcers or rashes. No psoriasis.  Psychiatric: Alert and oriented x 3.  Recent memory intact, remote memory unclear.  Normal mood and affect. Well groomed.  Good eye contact.  Cardiovascular: overall no swelling, no varicosities, no edema bilaterally, normal temperatures of the legs and arms, no clubbing, cyanosis and good capillary refill.  Lymphatic: palpation is normal.    The patient has been educated about the nature of the problem(s) and counseled on treatment options.  The patient appeared to understand what I have discussed and is in agreement with it.  Encounter Diagnoses  Name Primary?  . Idiopathic chronic gout of right knee without tophus   . Hereditary and idiopathic peripheral neuropathy Yes  .  Pain in both feet     PLAN Call if any problems.  Precautions discussed.  Continue current medications.   Return to clinic 3 months   I have reviewed the [facility] Controlled Substance Reporting System web site prior to prescribing narcotic medicine for this patient.  Electronically Signed Darreld Mclean, MD 4/25/20189:51 AM

## 2016-12-06 ENCOUNTER — Telehealth: Payer: Self-pay | Admitting: Orthopaedic Surgery

## 2016-12-06 MED ORDER — HYDROCODONE-ACETAMINOPHEN 7.5-325 MG PO TABS: 1.0000 | ORAL_TABLET | Freq: Four times a day (QID) | ORAL | 0 refills | Status: DC | PRN

## 2016-12-06 NOTE — Telephone Encounter (Signed)
Patient requests refill:  °HYDROcodone-acetaminophen (NORCO) 7.5-325 MG tablet 100 tablet  ° ° °

## 2017-01-09 ENCOUNTER — Other Ambulatory Visit: Payer: Self-pay | Admitting: *Deleted

## 2017-01-09 ENCOUNTER — Telehealth: Payer: Self-pay | Admitting: Orthopedic Surgery

## 2017-01-09 MED ORDER — HYDROCODONE-ACETAMINOPHEN 7.5-325 MG PO TABS: 1.0000 | ORAL_TABLET | Freq: Four times a day (QID) | ORAL | 0 refills | Status: DC | PRN

## 2017-01-09 NOTE — Telephone Encounter (Signed)
Pt of Dr. Sanjuan DameKeeling's requests refill on Hydrocodone/Acetaminophen (Norco)  7.5-325  Mgs.   Qty  95   Sig: Take 1 tablet by mouth every 6 (six) hours as needed for moderate pain (Must last 30 days.Do not drive or operate machinery while taking this medicine.).

## 2017-02-01 ENCOUNTER — Ambulatory Visit (INDEPENDENT_AMBULATORY_CARE_PROVIDER_SITE_OTHER): Payer: BLUE CROSS/BLUE SHIELD | Admitting: Orthopaedic Surgery

## 2017-02-01 ENCOUNTER — Encounter: Payer: Self-pay | Admitting: Orthopaedic Surgery

## 2017-02-01 VITALS — BP 143/88 | HR 70 | Temp 98.3°F | Ht 70.0 in | Wt 196.0 lb

## 2017-02-01 DIAGNOSIS — M1A0610 Idiopathic chronic gout, right knee, without tophus (tophi): Secondary | ICD-10-CM | POA: Diagnosis not present

## 2017-02-01 NOTE — Progress Notes (Signed)
Patient ZO:XWRUEAV:Jeff Benton, male DOB:[birth year], 51 y.o. WUJ:[id number]  Chief Complaint  Patient presents with  . Follow-up    gout    HPI  Jeff Benton is a 51 y.o. male who has chronic gout. He has had no acute attack.  He is taking his medicine.  He has had some pain of the left knee medial hamstring area at times.  He has no trauma. HPI  Body mass index is 28.12 kg/m.  ROS  Review of Systems  Constitutional:       Patient does not have Diabetes Mellitus. Patient does not have hypertension. Patient does not have COPD or shortness of breath. Patient does not have BMI > 35. Patient does not have current smoking history.  HENT: Negative for congestion.   Respiratory: Negative for shortness of breath.   Cardiovascular: Negative for chest pain.  Endocrine: Positive for cold intolerance.  Musculoskeletal: Positive for gait problem and joint swelling.    Past Medical History:  Diagnosis Date  . Gout   . Peripheral neuropathy     No past surgical history on file.  Family History  Problem Relation Age of Onset  . Hypotension Mother   . Emphysema Father     Social History Social History  Substance Use Topics  . Smoking status: Former Smoker    Types: Cigarettes    Quit date: 08/03/2015  . Smokeless tobacco: Never Used  . Alcohol use 0.0 oz/week     Comment: occasional    Allergies  Allergen Reactions  . Allopurinol Swelling    Current Outpatient Prescriptions  Medication Sig Dispense Refill  . Cholecalciferol (VITAMIN D3) 2000 units TABS Take by mouth daily.    . Febuxostat (ULORIC) 80 MG TABS Take 1 tablet (80 mg total) by mouth daily. 30 tablet 5  . gabapentin (NEURONTIN) 300 MG capsule Take 1 capsule (300 mg total) by mouth 3 (three) times daily. 90 capsule 11  . HYDROcodone-acetaminophen (NORCO) 7.5-325 MG tablet Take 1 tablet by mouth every 6 (six) hours as needed for moderate pain (Must last 30 days.Do not drive or operate machinery while taking  this medicine.). 95 tablet 0   No current facility-administered medications for this visit.      Physical Exam  Blood pressure (!) 143/88, pulse 70, temperature 98.3 F (36.8 C), height 5\' 10"  (1.778 m), weight 196 lb (88.9 kg).  Constitutional: overall normal hygiene, normal nutrition, well developed, normal grooming, normal body habitus. Assistive device:none  Musculoskeletal: gait and station Limp none, muscle tone and strength are normal, no tremors or atrophy is present.  .  Neurological: coordination overall normal.  Deep tendon reflex/nerve stretch intact.  Sensation normal.  Cranial nerves II-XII intact.   Skin:   Normal overall no scars, lesions, ulcers or rashes. No psoriasis.  Psychiatric: Alert and oriented x 3.  Recent memory intact, remote memory unclear.  Normal mood and affect. Well groomed.  Good eye contact.  Cardiovascular: overall no swelling, no varicosities, no edema bilaterally, normal temperatures of the legs and arms, no clubbing, cyanosis and good capillary refill.  Lymphatic: palpation is normal.  He has full ROM of both knees with no effusion, NV intact.  Negative exam.  The patient has been educated about the nature of the problem(s) and counseled on treatment options.  The patient appeared to understand what I have discussed and is in agreement with it.  Encounter Diagnosis  Name Primary?  . Idiopathic chronic gout of right knee without tophus Yes  PLAN Call if any problems.  Precautions discussed.  Continue current medications.   Return to clinic 3 months   Electronically Signed Darreld McleanWayne Makaylynn Bonillas, MD 7/25/201810:23 AM

## 2017-02-08 ENCOUNTER — Telehealth: Payer: Self-pay | Admitting: Orthopaedic Surgery

## 2017-02-08 MED ORDER — HYDROCODONE-ACETAMINOPHEN 7.5-325 MG PO TABS: 1.0000 | ORAL_TABLET | Freq: Four times a day (QID) | ORAL | 0 refills | Status: DC | PRN

## 2017-02-08 NOTE — Telephone Encounter (Signed)
Patient called for refill:  HYDROcodone-acetaminophen (NORCO) 7.5-325 MG tablet 95 tablet

## 2017-03-14 ENCOUNTER — Telehealth: Payer: Self-pay | Admitting: Orthopaedic Surgery

## 2017-03-14 MED ORDER — HYDROCODONE-ACETAMINOPHEN 7.5-325 MG PO TABS: 1.0000 | ORAL_TABLET | Freq: Four times a day (QID) | ORAL | 0 refills | Status: DC | PRN

## 2017-03-14 NOTE — Telephone Encounter (Signed)
Patient called to request refill:  °HYDROcodone-acetaminophen (NORCO) 7.5-325 MG tablet 95 tablet  ° ° °

## 2017-04-12 ENCOUNTER — Telehealth: Payer: Self-pay | Admitting: Orthopaedic Surgery

## 2017-04-12 NOTE — Telephone Encounter (Signed)
Requesting refill:  HYDROcodone-acetaminophen (NORCO) 7.5-325 MG tablet 90 tablet

## 2017-04-13 MED ORDER — HYDROCODONE-ACETAMINOPHEN 7.5-325 MG PO TABS: 1.0000 | ORAL_TABLET | Freq: Four times a day (QID) | ORAL | 0 refills | Status: DC | PRN

## 2017-05-04 ENCOUNTER — Encounter: Payer: Self-pay | Admitting: Orthopaedic Surgery

## 2017-05-04 ENCOUNTER — Ambulatory Visit (INDEPENDENT_AMBULATORY_CARE_PROVIDER_SITE_OTHER): Payer: BLUE CROSS/BLUE SHIELD | Admitting: Orthopaedic Surgery

## 2017-05-04 VITALS — BP 141/80 | HR 78 | Temp 98.0°F | Ht 70.0 in | Wt 193.0 lb

## 2017-05-04 DIAGNOSIS — G8929 Other chronic pain: Secondary | ICD-10-CM | POA: Diagnosis not present

## 2017-05-04 DIAGNOSIS — M25561 Pain in right knee: Secondary | ICD-10-CM | POA: Diagnosis not present

## 2017-05-04 DIAGNOSIS — M25562 Pain in left knee: Secondary | ICD-10-CM

## 2017-05-04 MED ORDER — HYDROCODONE-ACETAMINOPHEN 7.5-325 MG PO TABS: 1.0000 | ORAL_TABLET | Freq: Four times a day (QID) | ORAL | 0 refills | Status: DC | PRN

## 2017-05-04 NOTE — Progress Notes (Signed)
Patient Jeff Benton Benton, male DOB:1966/03/11, 51 y.o. Jeff Benton Benton  Chief Complaint  Patient presents with  . Knee Pain    bilateral     HPI  Jeff Benton Benton is a 51 y.o. male who has gout and bilateral knee pain from DJD.  He has no new trauma, no giving way, no redness.  He has some swelling and more pain on the right knee.  He has not had a new gout attack. HPI  Body mass index is 27.69 kg/m.  ROS  Review of Systems  Constitutional:       Patient does not have Diabetes Mellitus. Patient does not have hypertension. Patient does not have COPD or shortness of breath. Patient does not have BMI > 35. Patient does not have current smoking history.  HENT: Negative for congestion.   Respiratory: Negative for shortness of breath.   Cardiovascular: Negative for chest pain.  Endocrine: Positive for cold intolerance.  Musculoskeletal: Positive for gait problem and joint swelling.    Past Medical History:  Diagnosis Date  . Gout   . Peripheral neuropathy     No past surgical history on file.  Family History  Problem Relation Age of Onset  . Hypotension Mother   . Emphysema Father   . Stroke Brother     Social History Social History  Substance Use Topics  . Smoking status: Former Smoker    Types: Cigarettes    Quit date: 08/03/2015  . Smokeless tobacco: Never Used  . Alcohol use 0.0 oz/week     Comment: occasional    Allergies  Allergen Reactions  . Allopurinol Swelling    Current Outpatient Prescriptions  Medication Sig Dispense Refill  . Cholecalciferol (VITAMIN D3) 2000 units TABS Take by mouth daily.    . Febuxostat (ULORIC) 80 MG TABS Take 1 tablet (80 mg total) by mouth daily. 30 tablet 5  . gabapentin (NEURONTIN) 300 MG capsule Take 1 capsule (300 mg total) by mouth 3 (three) times daily. 90 capsule 11  . HYDROcodone-acetaminophen (NORCO) 7.5-325 MG tablet Take 1 tablet by mouth every 6 (six) hours as needed for moderate pain (Must last 30 days.Do  not drive or operate machinery while taking this medicine.). 90 tablet 0   No current facility-administered medications for this visit.      Physical Exam  Blood pressure (!) 141/80, pulse 78, temperature 98 F (36.7 C), height 5\' 10"  (1.778 m), weight 193 lb (87.5 kg).  Constitutional: overall normal hygiene, normal nutrition, well developed, normal grooming, normal body habitus. Assistive device:none  Musculoskeletal: gait and station Limp right, muscle tone and strength are normal, no tremors or atrophy is present.  .  Neurological: coordination overall normal.  Deep tendon reflex/nerve stretch intact.  Sensation normal.  Cranial nerves II-XII intact.   Skin:   Normal overall no scars, lesions, ulcers or rashes. No psoriasis.  Psychiatric: Alert and oriented x 3.  Recent memory intact, remote memory unclear.  Normal mood and affect. Well groomed.  Good eye contact.  Cardiovascular: overall no swelling, no varicosities, no edema bilaterally, normal temperatures of the legs and arms, no clubbing, cyanosis and good capillary refill.  Lymphatic: palpation is normal.  All other systems reviewed and are negative   The bilateral lower extremity is examined:  Inspection:  Thigh:  Non-tender and no defects  Knee has swelling 1/2+ effusion.                        Joint  tenderness is present                        Patient is tender over the medial joint line  Lower Leg:  Has normal appearance and no tenderness or defects  Ankle:  Non-tender and no defects  Foot:  Non-tender and no defects Range of Motion:  Knee:  Range of motion is: 0-110 right, 0-115 left                        Crepitus is  present  Ankle:  Range of motion is normal. Strength and Tone:  The bilateral lower extremity has normal strength and tone. Stability:  Knee:  The knee is stable.  Ankle:  The ankle is stable.   The patient has been educated about the nature of the problem(s) and counseled on treatment  options.  The patient appeared to understand what I have discussed and is in agreement with it.  Encounter Diagnoses  Name Primary?  . Chronic pain of right knee Yes  . Chronic pain of left knee     PLAN Call if any problems.  Precautions discussed.  Continue current medications.   Return to clinic 3 months   I [facility] [place] [facility] [facility] web site prior to prescribing narcotic medicine for this patient.  Electronically Signed Darreld McleanWayne Arlee Santosuosso, MD 10/25/20181:59 PM

## 2017-06-14 ENCOUNTER — Telehealth: Payer: Self-pay | Admitting: Orthopaedic Surgery

## 2017-06-14 MED ORDER — HYDROCODONE-ACETAMINOPHEN 7.5-325 MG PO TABS: 1.0000 | ORAL_TABLET | Freq: Four times a day (QID) | ORAL | 0 refills | Status: DC | PRN

## 2017-06-14 NOTE — Telephone Encounter (Signed)
Patient called for refill:  HYDROcodone-acetaminophen (NORCO) 7.5-325 MG tablet 90 tablet

## 2017-06-15 ENCOUNTER — Telehealth: Payer: Self-pay | Admitting: Radiology

## 2017-06-15 NOTE — Telephone Encounter (Signed)
Patient called back about his Hydrocodone, advised he can p/u tomorrow.

## 2017-07-17 ENCOUNTER — Telehealth: Payer: Self-pay | Admitting: Orthopaedic Surgery

## 2017-07-17 NOTE — Telephone Encounter (Signed)
Patient requests refill on Hydrocodone/Acetaminophen 7.5-325  Mgs.   Qty  90   Sig: Take 1 tablet by mouth every 6 (six) hours as needed for moderate pain (Must last 30 days.Do not drive or operate machinery while taking this medicine.).  Patient states he uses [facility]

## 2017-07-18 ENCOUNTER — Encounter (INDEPENDENT_AMBULATORY_CARE_PROVIDER_SITE_OTHER): Payer: Self-pay

## 2017-07-18 MED ORDER — HYDROCODONE-ACETAMINOPHEN 7.5-325 MG PO TABS: 1.0000 | ORAL_TABLET | Freq: Four times a day (QID) | ORAL | 0 refills | Status: DC | PRN

## 2017-08-02 ENCOUNTER — Encounter: Payer: Self-pay | Admitting: Orthopaedic Surgery

## 2017-08-02 ENCOUNTER — Ambulatory Visit: Payer: BLUE CROSS/BLUE SHIELD | Admitting: Orthopaedic Surgery

## 2017-08-02 ENCOUNTER — Ambulatory Visit (INDEPENDENT_AMBULATORY_CARE_PROVIDER_SITE_OTHER): Payer: BLUE CROSS/BLUE SHIELD

## 2017-08-02 VITALS — BP 164/95 | HR 66 | Ht 70.0 in | Wt 199.0 lb

## 2017-08-02 DIAGNOSIS — G8929 Other chronic pain: Secondary | ICD-10-CM | POA: Diagnosis not present

## 2017-08-02 DIAGNOSIS — M25512 Pain in left shoulder: Secondary | ICD-10-CM

## 2017-08-02 NOTE — Progress Notes (Signed)
Patient Jeff Benton, male DOB:1965/11/15, 52 y.o. WUJ:[id number]  Chief Complaint  Patient presents with  . New Problem    Left Shoulder pain    HPI  Jeff Benton is a 52 y.o. male who has had pain of the left shoulder over the last six to eight weeks getting worse.  He has pain with overhead use.  He is using his shoulder less and less because of pain.  It hurts at night when turning on it.  He has no trauma, no redness, no paresthesias.  Nothing seems to help it, ice, heat, rubs, Tylenol or Advil.  HPI  Body mass index is 28.55 kg/m.  ROS  Review of Systems  Constitutional:       Patient does not have Diabetes Mellitus. Patient does not have hypertension. Patient does not have COPD or shortness of breath. Patient does not have BMI > 35. Patient does not have current smoking history.  HENT: Negative for congestion.   Respiratory: Negative for shortness of breath.   Cardiovascular: Negative for chest pain.  Endocrine: Positive for cold intolerance.  Musculoskeletal: Positive for gait problem and joint swelling.    Past Medical History:  Diagnosis Date  . Gout   . Peripheral neuropathy     History reviewed. No pertinent surgical history.  Family History  Problem Relation Age of Onset  . Hypotension Mother   . Emphysema Father   . Stroke Brother     Social History Social History   Tobacco Use  . Smoking status: Former Smoker    Types: Cigarettes    Last attempt to quit: 08/03/2015    Years since quitting: 2.0  . Smokeless tobacco: Never Used  Substance Use Topics  . Alcohol use: Yes    Alcohol/week: 0.0 oz    Comment: occasional  . Drug use: No    Allergies  Allergen Reactions  . Allopurinol Swelling    Current Outpatient Medications  Medication Sig Dispense Refill  . Cholecalciferol (VITAMIN D3) 2000 units TABS Take by mouth daily.    . Febuxostat (ULORIC) 80 MG TABS Take 1 tablet (80 mg total) by mouth daily. 30 tablet 5  . gabapentin  (NEURONTIN) 300 MG capsule Take 1 capsule (300 mg total) by mouth 3 (three) times daily. 90 capsule 11  . HYDROcodone-acetaminophen (NORCO) 7.5-325 MG tablet Take 1 tablet by mouth every 6 (six) hours as needed for moderate pain (Must last 30 days.Do not drive or operate machinery while taking this medicine.). 90 tablet 0   No current facility-administered medications for this visit.      Physical Exam  Blood pressure (!) 164/95, pulse 66, height 5\' 10"  (1.778 m), weight 199 lb (90.3 kg).  Constitutional: overall normal hygiene, normal nutrition, well developed, normal grooming, normal body habitus. Assistive device:none  Musculoskeletal: gait and station Limp none, muscle tone and strength are normal, no tremors or atrophy is present.  .  Neurological: coordination overall normal.  Deep tendon reflex/nerve stretch intact.  Sensation normal.  Cranial nerves II-XII intact.   Skin:   Normal overall no scars, lesions, ulcers or rashes. No psoriasis.  Psychiatric: Alert and oriented x 3.  Recent memory intact, remote memory unclear.  Normal mood and affect. Well groomed.  Good eye contact.  Cardiovascular: overall no swelling, no varicosities, no edema bilaterally, normal temperatures of the legs and arms, no clubbing, cyanosis and good capillary refill.  Lymphatic: palpation is normal.  Examination of left Upper Extremity is done.  Inspection:  Overall:  Elbow non-tender without crepitus or defects, forearm non-tender without crepitus or defects, wrist non-tender without crepitus or defects, hand non-tender.    Shoulder: with glenohumeral joint tenderness, without effusion.   Upper arm: without swelling and tenderness   Range of motion:   Overall:  Full range of motion of the elbow, full range of motion of wrist and full range of motion in fingers.   Shoulder:  left  85 degrees forward flexion; 75 degrees abduction; 20 degrees internal rotation, 20 degrees external rotation, 10  degrees extension, 30 degrees adduction.   Stability:   Overall:  Shoulder, elbow and wrist stable   Strength and Tone:   Overall full shoulder muscles strength, full upper arm strength and normal upper arm bulk and tone. All other systems reviewed and are negative   The patient has been educated about the nature of the problem(s) and counseled on treatment options.  The patient appeared to understand what I have discussed and is in agreement with it.  Encounter Diagnosis  Name Primary?  . Chronic left shoulder pain Yes    PROCEDURE NOTE:  The patient request injection, verbal consent was obtained.  The left shoulder was prepped appropriately after time out was performed.   Sterile technique was observed and injection of 1 cc of Depo-Medrol 40 mg with several cc's of plain xylocaine. Anesthesia was provided by ethyl chloride and a 20-gauge needle was used to inject the shoulder area. A posterior approach was used.  The injection was tolerated well.  A band aid dressing was applied.  The patient was advised to apply ice later today and tomorrow to the injection sight as needed.   PLAN Call if any problems.  Precautions discussed.  Continue current medications.   Return to clinic 2 weeks  Begin OT/PT for the left shoulder.  Electronically Signed Darreld McleanWayne Ravis Herne, MD 1/23/20193:06 PM

## 2017-08-02 NOTE — Patient Instructions (Addendum)
Physical therapy has been ordered for you at [facility] [phone] is the phone number to call if you want to call to schedule. Please let us know if you do not hear anything within one week.    Shoulder Exercises Ask your health care provider which exercises are safe for you. Do exercises exactly as told by your health care provider and adjust them as directed. It is normal to feel mild stretching, pulling, tightness, or discomfort as you do these exercises, but you should stop right away if you feel sudden pain or your pain gets worse.Do not begin these exercises until told by your health care provider. RANGE OF MOTION EXERCISES These exercises warm up your muscles and joints and improve the movement and flexibility of your shoulder. These exercises also help to relieve pain, numbness, and tingling. These exercises involve stretching your injured shoulder directly. Exercise A: Pendulum  1. Stand near a wall or a surface that you can hold onto for balance. 2. Bend at the waist and let your left / right arm hang straight down. Use your other arm to support you. Keep your back straight and do not lock your knees. 3. Relax your left / right arm and shoulder muscles, and move your hips and your trunk so your left / right arm swings freely. Your arm should swing because of the motion of your body, not because you are using your arm or shoulder muscles. 4. Keep moving your body so your arm swings in the following directions, as told by your health care provider: ? Side to side. ? Forward and backward. ? In clockwise and counterclockwise circles. 5. Continue each motion for __________ seconds, or for as long as told by your health care provider. 6. Slowly return to the starting position. Repeat __________ times. Complete this exercise __________ times a day. Exercise B:Flexion, Standing  1. Stand and hold a broomstick, a cane, or a similar object. Place your hands a little more than shoulder-width  apart on the object. Your left / right hand should be palm-up, and your other hand should be palm-down. 2. Keep your elbow straight and keep your shoulder muscles relaxed. Push the stick down with your healthy arm to raise your left / right arm in front of your body, and then over your head until you feel a stretch in your shoulder. ? Avoid shrugging your shoulder while you raise your arm. Keep your shoulder blade tucked down toward the middle of your back. 3. Hold for __________ seconds. 4. Slowly return to the starting position. Repeat __________ times. Complete this exercise __________ times a day. Exercise C: Abduction, Standing 1. Stand and hold a broomstick, a cane, or a similar object. Place your hands a little more than shoulder-width apart on the object. Your left / right hand should be palm-up, and your other hand should be palm-down. 2. While keeping your elbow straight and your shoulder muscles relaxed, push the stick across your body toward your left / right side. Raise your left / right arm to the side of your body and then over your head until you feel a stretch in your shoulder. ? Do not raise your arm above shoulder height, unless your health care provider tells you to do that. ? Avoid shrugging your shoulder while you raise your arm. Keep your shoulder blade tucked down toward the middle of your back. 3. Hold for __________ seconds. 4. Slowly return to the starting position. Repeat __________ times. Complete this exercise  __________ times a day. Exercise D:Internal Rotation  1. Place your left / right hand behind your back, palm-up. 2. Use your other hand to dangle an exercise band, a towel, or a similar object over your shoulder. Grasp the band with your left / right hand so you are holding onto both ends. 3. Gently pull up on the band until you feel a stretch in the front of your left / right shoulder. ? Avoid shrugging your shoulder while you raise your arm. Keep your  shoulder blade tucked down toward the middle of your back. 4. Hold for __________ seconds. 5. Release the stretch by letting go of the band and lowering your hands. Repeat __________ times. Complete this exercise __________ times a day. STRETCHING EXERCISES These exercises warm up your muscles and joints and improve the movement and flexibility of your shoulder. These exercises also help to relieve pain, numbness, and tingling. These exercises are done using your healthy shoulder to help stretch the muscles of your injured shoulder. Exercise E: Research officer, political partyCorner Stretch (External Rotation and Abduction)  1. Stand in a doorway with one of your feet slightly in front of the other. This is called a staggered stance. If you cannot reach your forearms to the door frame, stand facing a corner of a room. 2. Choose one of the following positions as told by your health care provider: ? Place your hands and forearms on the door frame above your head. ? Place your hands and forearms on the door frame at the height of your head. ? Place your hands on the door frame at the height of your elbows. 3. Slowly move your weight onto your front foot until you feel a stretch across your chest and in the front of your shoulders. Keep your head and chest upright and keep your abdominal muscles tight. 4. Hold for __________ seconds. 5. To release the stretch, shift your weight to your back foot. Repeat __________ times. Complete this stretch __________ times a day. Exercise F:Extension, Standing 1. Stand and hold a broomstick, a cane, or a similar object behind your back. ? Your hands should be a little wider than shoulder-width apart. ? Your palms should face away from your back. 2. Keeping your elbows straight and keeping your shoulder muscles relaxed, move the stick away from your body until you feel a stretch in your shoulder. ? Avoid shrugging your shoulders while you move the stick. Keep your shoulder blade tucked down  toward the middle of your back. 3. Hold for __________ seconds. 4. Slowly return to the starting position. Repeat __________ times. Complete this exercise __________ times a day. STRENGTHENING EXERCISES These exercises build strength and endurance in your shoulder. Endurance is the ability to use your muscles for a long time, even after they get tired. Exercise G:External Rotation  1. Sit in a stable chair without armrests. 2. Secure an exercise band at elbow height on your left / right side. 3. Place a soft object, such as a folded towel or a small pillow, between your left / right upper arm and your body to move your elbow a few inches away (about 10 cm) from your side. 4. Hold the end of the band so it is tight and there is no slack. 5. Keeping your elbow pressed against the soft object, move your left / right forearm out, away from your abdomen. Keep your body steady so only your forearm moves. 6. Hold for __________ seconds. 7. Slowly return to the starting position. Repeat  __________ times. Complete this exercise __________ times a day. Exercise H:Shoulder Abduction  1. Sit in a stable chair without armrests, or stand. 2. Hold a __________ weight in your left / right hand, or hold an exercise band with both hands. 3. Start with your arms straight down and your left / right palm facing in, toward your body. 4. Slowly lift your left / right hand out to your side. Do not lift your hand above shoulder height unless your health care provider tells you that this is safe. ? Keep your arms straight. ? Avoid shrugging your shoulder while you do this movement. Keep your shoulder blade tucked down toward the middle of your back. 5. Hold for __________ seconds. 6. Slowly lower your arm, and return to the starting position. Repeat __________ times. Complete this exercise __________ times a day. Exercise I:Shoulder Extension 1. Sit in a stable chair without armrests, or stand. 2. Secure an  exercise band to a stable object in front of you where it is at shoulder height. 3. Hold one end of the exercise band in each hand. Your palms should face each other. 4. Straighten your elbows and lift your hands up to shoulder height. 5. Step back, away from the secured end of the exercise band, until the band is tight and there is no slack. 6. Squeeze your shoulder blades together as you pull your hands down to the sides of your thighs. Stop when your hands are straight down by your sides. Do not let your hands go behind your body. 7. Hold for __________ seconds. 8. Slowly return to the starting position. Repeat __________ times. Complete this exercise __________ times a day. Exercise J:Standing Shoulder Row 1. Sit in a stable chair without armrests, or stand. 2. Secure an exercise band to a stable object in front of you so it is at waist height. 3. Hold one end of the exercise band in each hand. Your palms should be in a thumbs-up position. 4. Bend each of your elbows to an "L" shape (about 90 degrees) and keep your upper arms at your sides. 5. Step back until the band is tight and there is no slack. 6. Slowly pull your elbows back behind you. 7. Hold for __________ seconds. 8. Slowly return to the starting position. Repeat __________ times. Complete this exercise __________ times a day. Exercise K:Shoulder Press-Ups  1. Sit in a stable chair that has armrests. Sit upright, with your feet flat on the floor. 2. Put your hands on the armrests so your elbows are bent and your fingers are pointing forward. Your hands should be about even with the sides of your body. 3. Push down on the armrests and use your arms to lift yourself off of the chair. Straighten your elbows and lift yourself up as much as you comfortably can. ? Move your shoulder blades down, and avoid letting your shoulders move up toward your ears. ? Keep your feet on the ground. As you get stronger, your feet should support  less of your body weight as you lift yourself up. 4. Hold for __________ seconds. 5. Slowly lower yourself back into the chair. Repeat __________ times. Complete this exercise __________ times a day. Exercise L: Wall Push-Ups  1. Stand so you are facing a stable wall. Your feet should be about one arm-length away from the wall. 2. Lean forward and place your palms on the wall at shoulder height. 3. Keep your feet flat on the floor as you bend your  elbows and lean forward toward the wall. 4. Hold for __________ seconds. 5. Straighten your elbows to push yourself back to the starting position. Repeat __________ times. Complete this exercise __________ times a day. This information is not intended to replace advice given to you by your health care provider. Make sure you discuss any questions you have with your health care provider. Document Released: 05/11/2005 Document Revised: 03/21/2016 Document Reviewed: 03/08/2015 Elsevier Interactive Patient Education  2018 [employer].

## 2017-08-07 ENCOUNTER — Ambulatory Visit (HOSPITAL_COMMUNITY): Payer: BLUE CROSS/BLUE SHIELD | Admitting: Specialist

## 2017-08-07 ENCOUNTER — Telehealth (HOSPITAL_COMMUNITY): Payer: Self-pay | Admitting: Internal Medicine

## 2017-08-07 NOTE — Telephone Encounter (Signed)
08/07/17  pt called and cx said he had been out of work and now had some work that he needed to get ... appt was rescheduled for 1/30

## 2017-08-09 ENCOUNTER — Ambulatory Visit (HOSPITAL_COMMUNITY): Payer: BLUE CROSS/BLUE SHIELD | Attending: Orthopaedic Surgery

## 2017-08-16 ENCOUNTER — Ambulatory Visit: Payer: BLUE CROSS/BLUE SHIELD | Admitting: Orthopaedic Surgery

## 2017-08-16 ENCOUNTER — Encounter: Payer: Self-pay | Admitting: Orthopaedic Surgery

## 2017-08-16 VITALS — BP 162/91 | HR 81 | Ht 70.0 in | Wt 204.0 lb

## 2017-08-16 DIAGNOSIS — G8929 Other chronic pain: Secondary | ICD-10-CM

## 2017-08-16 DIAGNOSIS — M25512 Pain in left shoulder: Secondary | ICD-10-CM

## 2017-08-16 MED ORDER — HYDROCODONE-ACETAMINOPHEN 7.5-325 MG PO TABS: 1.0000 | ORAL_TABLET | Freq: Four times a day (QID) | ORAL | 0 refills | Status: DC | PRN

## 2017-08-16 NOTE — Progress Notes (Signed)
CC:  My shoulder is much better  He got an injection last time to the left shoulder. He is much improved today with full ROM and NV intact.  He has no pain.  Encounter Diagnosis  Name Primary?  . Chronic left shoulder pain Yes   I will see him as needed.  I have reviewed the [place] Sub[place] web site prior to prescribing narcotic medicine for this patient.  Electronically Signed Darreld McleanWayne Brinae Woods, MD 2/6/20191:47 PM

## 2017-09-14 ENCOUNTER — Other Ambulatory Visit: Payer: Self-pay | Admitting: Orthopaedic Surgery

## 2017-09-14 ENCOUNTER — Other Ambulatory Visit: Payer: Self-pay | Admitting: Orthopedic Surgery

## 2017-09-14 MED ORDER — HYDROCODONE-ACETAMINOPHEN 7.5-325 MG PO TABS: 1.0000 | ORAL_TABLET | Freq: Four times a day (QID) | ORAL | 0 refills | Status: DC | PRN

## 2017-09-14 NOTE — Telephone Encounter (Signed)
Patient requests refill: HYDROcodone-acetaminophen (NORCO) 7.5-325 MG tablet - aware that while Dr Hilda LiasKeeling is out of clinic, Dr Romeo AppleHarrison is reviewing requests; [facility], [place]

## 2017-09-15 ENCOUNTER — Other Ambulatory Visit: Payer: Self-pay | Admitting: Orthopaedic Surgery

## 2017-10-17 ENCOUNTER — Other Ambulatory Visit: Payer: Self-pay | Admitting: Orthopaedic Surgery

## 2017-10-17 NOTE — Telephone Encounter (Signed)
Patient of Dr. Sanjuan DameKeeling's request refill on Uloric 80 mgs.  Qty  30  Sig: Take 1 tablet (80 mg total) by mouth daily.  Patient states he uses [facility]

## 2017-10-17 NOTE — Telephone Encounter (Signed)
Patient of Dr. Ruben GottronKeelling's requests refill on Hydrocodone/Acetaminophen 7.5-325  Mgs.   Qty  90       Sig: Take 1 tablet by mouth every 6 (six) hours as needed for moderate pain.  Patient states he uses [facility]

## 2017-10-18 MED ORDER — FEBUXOSTAT 80 MG PO TABS: 1.0000 | ORAL_TABLET | Freq: Every day | ORAL | 5 refills | Status: AC

## 2017-10-18 MED ORDER — HYDROCODONE-ACETAMINOPHEN 7.5-325 MG PO TABS: 1.0000 | ORAL_TABLET | Freq: Four times a day (QID) | ORAL | 0 refills | Status: DC | PRN

## 2017-11-15 ENCOUNTER — Other Ambulatory Visit: Payer: Self-pay | Admitting: Orthopaedic Surgery

## 2017-11-15 MED ORDER — HYDROCODONE-ACETAMINOPHEN 7.5-325 MG PO TABS: 1.0000 | ORAL_TABLET | Freq: Four times a day (QID) | ORAL | 0 refills | Status: AC | PRN

## 2017-11-15 NOTE — Telephone Encounter (Signed)
Patient of Dr. Sanjuan Dame requests refill on Hydrocodone/Acetaminophen 7.5-325  Mgs.   Qty  90  Sig: Take 1 tablet by mouth every 6 (six) hours as needed for moderate pain.  Patient states he uses [facility]

## 2017-11-21 ENCOUNTER — Ambulatory Visit: Payer: Self-pay | Admitting: Orthopaedic Surgery
